# Patient Record
Sex: Male | Born: 1960
Health system: Southern US, Community
[De-identification: ages and names within clinical notes are randomized; demographics above are authoritative.]

## PROBLEM LIST (undated history)

## (undated) DIAGNOSIS — I10 Essential (primary) hypertension: Secondary | ICD-10-CM

## (undated) DIAGNOSIS — E785 Hyperlipidemia, unspecified: Secondary | ICD-10-CM

## (undated) HISTORY — PX: EYE SURGERY: SHX253

## (undated) HISTORY — DX: Hyperlipidemia, unspecified: E78.5

## (undated) HISTORY — DX: Essential (primary) hypertension: I10

## (undated) HISTORY — PX: LEG SURGERY: SHX1003

---

## 2006-12-07 ENCOUNTER — Ambulatory Visit: Payer: Self-pay | Admitting: Surgery

## 2010-05-20 ENCOUNTER — Ambulatory Visit: Payer: Self-pay | Admitting: Gastroenterology

## 2013-01-02 DIAGNOSIS — F17200 Nicotine dependence, unspecified, uncomplicated: Secondary | ICD-10-CM | POA: Insufficient documentation

## 2013-01-02 DIAGNOSIS — I10 Essential (primary) hypertension: Secondary | ICD-10-CM | POA: Insufficient documentation

## 2015-06-14 DIAGNOSIS — Z13220 Encounter for screening for lipoid disorders: Secondary | ICD-10-CM | POA: Diagnosis not present

## 2015-06-14 DIAGNOSIS — Z131 Encounter for screening for diabetes mellitus: Secondary | ICD-10-CM | POA: Diagnosis not present

## 2015-06-14 DIAGNOSIS — Z0000 Encounter for general adult medical examination without abnormal findings: Secondary | ICD-10-CM | POA: Insufficient documentation

## 2015-06-14 DIAGNOSIS — Z125 Encounter for screening for malignant neoplasm of prostate: Secondary | ICD-10-CM | POA: Diagnosis not present

## 2015-07-13 DIAGNOSIS — E119 Type 2 diabetes mellitus without complications: Secondary | ICD-10-CM | POA: Insufficient documentation

## 2016-11-19 DIAGNOSIS — Z13220 Encounter for screening for lipoid disorders: Secondary | ICD-10-CM | POA: Diagnosis not present

## 2016-11-19 DIAGNOSIS — Z0000 Encounter for general adult medical examination without abnormal findings: Secondary | ICD-10-CM | POA: Diagnosis not present

## 2016-11-19 DIAGNOSIS — R7309 Other abnormal glucose: Secondary | ICD-10-CM | POA: Diagnosis not present

## 2016-11-19 DIAGNOSIS — E119 Type 2 diabetes mellitus without complications: Secondary | ICD-10-CM | POA: Diagnosis not present

## 2016-11-19 DIAGNOSIS — Z1159 Encounter for screening for other viral diseases: Secondary | ICD-10-CM | POA: Diagnosis not present

## 2016-11-19 DIAGNOSIS — I10 Essential (primary) hypertension: Secondary | ICD-10-CM | POA: Diagnosis not present

## 2016-11-19 DIAGNOSIS — Z1389 Encounter for screening for other disorder: Secondary | ICD-10-CM | POA: Diagnosis not present

## 2018-01-06 DIAGNOSIS — Z130 Encounter for screening for diseases of the blood and blood-forming organs and certain disorders involving the immune mechanism: Secondary | ICD-10-CM | POA: Diagnosis not present

## 2018-01-06 DIAGNOSIS — E119 Type 2 diabetes mellitus without complications: Secondary | ICD-10-CM | POA: Diagnosis not present

## 2018-01-06 DIAGNOSIS — M2550 Pain in unspecified joint: Secondary | ICD-10-CM | POA: Diagnosis not present

## 2018-01-06 DIAGNOSIS — Z1389 Encounter for screening for other disorder: Secondary | ICD-10-CM | POA: Diagnosis not present

## 2018-01-06 DIAGNOSIS — I10 Essential (primary) hypertension: Secondary | ICD-10-CM | POA: Diagnosis not present

## 2018-01-06 DIAGNOSIS — E785 Hyperlipidemia, unspecified: Secondary | ICD-10-CM | POA: Insufficient documentation

## 2018-01-06 DIAGNOSIS — F17200 Nicotine dependence, unspecified, uncomplicated: Secondary | ICD-10-CM | POA: Diagnosis not present

## 2018-01-06 DIAGNOSIS — R7309 Other abnormal glucose: Secondary | ICD-10-CM | POA: Diagnosis not present

## 2018-10-03 IMAGING — CR DG CHOLANGIOGRAM OPERATIVE
1 series · 1 of 1 positions shown · non-contrast
Comparison: none

REASON FOR EXAM: Post-operative
COMMENTS:

PROCEDURE:     DXR - DXR CHOLANGIOGRAM OP (INITIAL)  - December 07, 2006  [DATE]
RESULT:     Contrast is visualized in the hepatic ducts and common duct.
Contrast is noted to flow into the duodenum without evidence of obstruction.
No retained stone is seen.

[imported/digitized images]
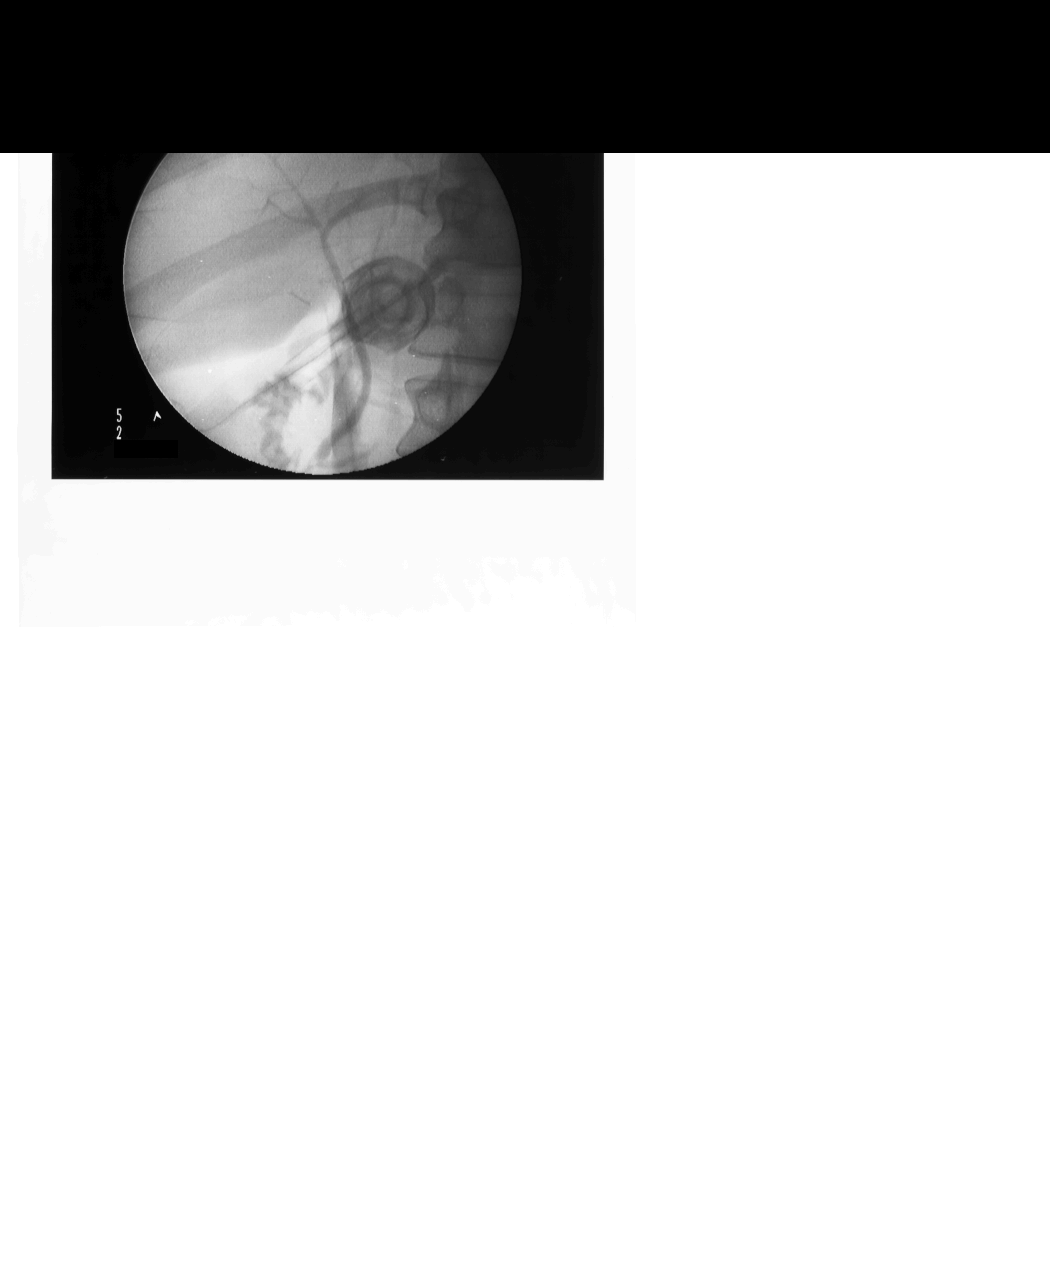

[1 of 1 positions shown; findings below may reference images not displayed]

IMPRESSION: Normal Operative Cholangiogram.

## 2019-02-14 DIAGNOSIS — E785 Hyperlipidemia, unspecified: Secondary | ICD-10-CM | POA: Diagnosis not present

## 2019-02-14 DIAGNOSIS — R001 Bradycardia, unspecified: Secondary | ICD-10-CM | POA: Diagnosis not present

## 2019-02-14 DIAGNOSIS — Z8249 Family history of ischemic heart disease and other diseases of the circulatory system: Secondary | ICD-10-CM | POA: Diagnosis not present

## 2019-02-14 DIAGNOSIS — I10 Essential (primary) hypertension: Secondary | ICD-10-CM | POA: Diagnosis not present

## 2019-02-14 DIAGNOSIS — E119 Type 2 diabetes mellitus without complications: Secondary | ICD-10-CM | POA: Diagnosis not present

## 2019-02-14 DIAGNOSIS — Z130 Encounter for screening for diseases of the blood and blood-forming organs and certain disorders involving the immune mechanism: Secondary | ICD-10-CM | POA: Diagnosis not present

## 2019-02-14 DIAGNOSIS — M2550 Pain in unspecified joint: Secondary | ICD-10-CM | POA: Diagnosis not present

## 2019-02-14 DIAGNOSIS — F17200 Nicotine dependence, unspecified, uncomplicated: Secondary | ICD-10-CM | POA: Diagnosis not present

## 2019-03-17 ENCOUNTER — Other Ambulatory Visit: Payer: Self-pay

## 2019-03-17 ENCOUNTER — Encounter: Payer: Self-pay | Admitting: Cardiology

## 2019-03-17 ENCOUNTER — Ambulatory Visit (INDEPENDENT_AMBULATORY_CARE_PROVIDER_SITE_OTHER): Payer: 59 | Admitting: Cardiology

## 2019-03-17 VITALS — BP 180/90 | HR 85 | Ht 70.0 in | Wt 201.5 lb

## 2019-03-17 DIAGNOSIS — I10 Essential (primary) hypertension: Secondary | ICD-10-CM | POA: Diagnosis not present

## 2019-03-17 DIAGNOSIS — F17200 Nicotine dependence, unspecified, uncomplicated: Secondary | ICD-10-CM | POA: Diagnosis not present

## 2019-03-17 DIAGNOSIS — Z8249 Family history of ischemic heart disease and other diseases of the circulatory system: Secondary | ICD-10-CM

## 2019-03-17 MED ORDER — HYDROCHLOROTHIAZIDE 25 MG PO TABS: 25.0000 mg | ORAL_TABLET | Freq: Every day | ORAL | 3 refills | Status: DC

## 2019-03-17 NOTE — Progress Notes (Signed)
Cardiology Office Note:    Date:  03/17/2019   ID:  Jack Richards, DOB 1960/06/08, MRN [id number]  PCP:  Patient, No Pcp Per  Cardiologist:  Debbe Odea, MD  Electrophysiologist:  None   Referring MD: Evie Lacks, NP   Chief Complaint  Patient presents with  . New Patient (Initial Visit)    Ref by Evie Lacks, NP for HTN. Meds reviewed by the pt. verbally. "doing well."     History of Present Illness:    Jack Richards is a 59 y.o. male with a hx of hypertension, hyperlipidemia, current smoker presents due to elevated blood pressures.  Patient has a history of high blood pressure for years now.  Usually takes benazepril 40 mg daily and recently started on HCTZ 12.5 mg daily.  He denies chest pain or shortness of breath at rest or with ambulation.  States not eating right, eats lots of processed foods with high salt content.  Has a family history of CAD in the father.  Also has family history of aortic aneurysm in the father.  He denies edema, palpitations, syncope.  He checks his blood pressure frequently at home which usually runs high.  Past Medical History:  Diagnosis Date  . Hyperlipidemia   . Hypertension     Past Surgical History:  Procedure Laterality Date  . EYE SURGERY    . LEG SURGERY     right leg has rods and pins     Current Medications: Current Meds  Medication Sig  . acetaminophen (TYLENOL) 500 MG tablet Take 500 mg by mouth every 6 (six) hours as needed.  Marland Kitchen aspirin EC 81 MG tablet Take 81 mg by mouth daily.  Marland Kitchen atorvastatin (LIPITOR) 20 MG tablet Take 20 mg by mouth daily.  . benazepril (LOTENSIN) 40 MG tablet Take 40 mg by mouth daily.   Marland Kitchen buPROPion (WELLBUTRIN XL) 150 MG 24 hr tablet Take 150 mg by mouth daily.   . [DISCONTINUED] hydrochlorothiazide (HYDRODIURIL) 12.5 MG tablet Take 12.5 mg by mouth daily.     Allergies:   Patient has no known allergies.   Social History   Socioeconomic History  . Marital status: Married   Spouse name: Not on file  . Number of children: Not on file  . Years of education: Not on file  . Highest education level: Not on file  Occupational History  . Not on file  Tobacco Use  . Smoking status: Current Every Day Smoker    Packs/day: 1.00    Years: 7.00    Pack years: 7.00    Types: Cigarettes  . Smokeless tobacco: Never Used  Substance and Sexual Activity  . Alcohol use: Yes    Comment: social   . Drug use: Never  . Sexual activity: Not on file  Other Topics Concern  . Not on file  Social History Narrative  . Not on file   Social Determinants of Health   Financial Resource Strain:   . Difficulty of Paying Living Expenses: Not on file  Food Insecurity:   . Worried About Programme researcher, broadcasting/film/video in the Last Year: Not on file  . Ran Out of Food in the Last Year: Not on file  Transportation Needs:   . Lack of Transportation (Medical): Not on file  . Lack of Transportation (Non-Medical): Not on file  Physical Activity:   . Days of Exercise per Week: Not on file  . Minutes of Exercise per Session: Not on file  Stress:   .  Feeling of Stress : Not on file  Social Connections:   . Frequency of Communication with Friends and Family: Not on file  . Frequency of Social Gatherings with Friends and Family: Not on file  . Attends Religious Services: Not on file  . Active Member of Clubs or Organizations: Not on file  . Attends Archivist Meetings: Not on file  . Marital Status: Not on file     Family History: The patient's family history includes ALS in his mother; Heart attack in his father; Heart disease in his brother; Heart disease (age of onset: 89) in his father; Hyperlipidemia in his father; Hypertension in his father.  ROS:   Please see the history of present illness.     All other systems reviewed and are negative.  EKGs/Labs/Other Studies Reviewed:    The following studies were reviewed today:   EKG:  EKG is  ordered today.  The ekg ordered today  demonstrates normal sinus rhythm, normal ECG.  Recent Labs: No results found for requested labs within last 8760 hours.  Recent Lipid Panel No results found for: CHOL, TRIG, HDL, CHOLHDL, VLDL, LDLCALC, LDLDIRECT  Physical Exam:    VS:  BP (!) 180/90 (BP Location: Right Arm, Patient Position: Sitting, Cuff Size: Normal)   Pulse 85   Ht 5\' 10"  (1.778 m)   Wt 201 lb 8 oz (91.4 kg)   BMI 28.91 kg/m     Wt Readings from Last 3 Encounters:  03/17/19 201 lb 8 oz (91.4 kg)     GEN:  Well nourished, well developed in no acute distress HEENT: Normal NECK: No JVD; No carotid bruits LYMPHATICS: No lymphadenopathy CARDIAC: RRR, no murmurs, rubs, gallops RESPIRATORY:  Clear to auscultation without rales, wheezing or rhonchi  ABDOMEN: Soft, non-tender, non-distended MUSCULOSKELETAL:  No edema; No deformity  SKIN: Warm and dry NEUROLOGIC:  Alert and oriented x 3 PSYCHIATRIC:  Normal affect   ASSESSMENT:    1. Hypertension, unspecified type   2. Family history of aortic aneurysm   3. Smoking    PLAN:    In order of problems listed above:  1. Blood pressure is still elevated.  Increase HCTZ to 25 mg daily.  Continue benazepril 40 mg daily.  Patient counseled on low-salt diet.  Check BP log daily.  Follow-up in 2 weeks.  Will consider adding a third BP med (CCB)on follow-up visit if blood pressures and not well controlled. 2. Patient with family history of aortic aneurysm.  Will get echocardiogram to evaluate aortic root size. 3. Patient with a 15-year smoking history.  Smoking cessation advised.  Follow-up in 2 weeks for BP management  This note was generated in part or whole with voice recognition software. Voice recognition is usually quite accurate but there are transcription errors that can and very often do occur. I apologize for any typographical errors that were not detected and corrected.  Medication Adjustments/Labs and Tests Ordered: Current medicines are reviewed at  length with the patient today.  Concerns regarding medicines are outlined above.  Orders Placed This Encounter  Procedures  . EKG 12-Lead  . ECHOCARDIOGRAM COMPLETE   Meds ordered this encounter  Medications  . hydrochlorothiazide (HYDRODIURIL) 25 MG tablet    Sig: Take 1 tablet (25 mg total) by mouth daily.    Dispense:  90 tablet    Refill:  3    Patient Instructions  Medication Instructions:  - Your physician has recommended you make the following change in your medication:  1) Increase hydrochlorothiazide (HCTZ) to 25 mg- take 1 tablet (25 mg) by mouth once daily  *If you need a refill on your cardiac medications before your next appointment, please call your pharmacy*  Lab Work: - none ordered  If you have labs (blood work) drawn today and your tests are completely normal, you will receive your results only by: Marland Kitchen MyChart Message (if you have MyChart) OR . A paper copy in the mail If you have any lab test that is abnormal or we need to change your treatment, we will call you to review the results.  Testing/Procedures: - Your physician has requested that you have an echocardiogram (ok if not done prior to follow up in 2 weeks). Echocardiography is a painless test that uses sound waves to create images of your heart. It provides your doctor with information about the size and shape of your heart and how well your heart's chambers and valves are working. This procedure takes approximately one hour. There are no restrictions for this procedure.   Follow-Up: At [facility], you and your health needs are our priority.  As part of our continuing mission to provide you with exceptional heart care, we have created designated Provider Care Teams.  These Care Teams include your primary Cardiologist (physician) and Advanced Practice Providers (APPs -  Physician Assistants and Nurse Practitioners) who all work together to provide you with the care you need, when you need it.  Your  next appointment:   2 week(s)  The format for your next appointment:   In Person  Provider:   Debbe Odea, MD  Other Instructions N/a   Echocardiogram An echocardiogram is a procedure that uses painless sound waves (ultrasound) to produce an image of the heart. Images from an echocardiogram can provide important information about:  Signs of coronary artery disease (CAD).  Aneurysm detection. An aneurysm is a weak or damaged part of an artery wall that bulges out from the normal force of blood pumping through the body.  Heart size and shape. Changes in the size or shape of the heart can be associated with certain conditions, including heart failure, aneurysm, and CAD.  Heart muscle function.  Heart valve function.  Signs of a past heart attack.  Fluid buildup around the heart.  Thickening of the heart muscle.  A tumor or infectious growth around the heart valves. Tell a health care provider about:  Any allergies you have.  All medicines you are taking, including vitamins, herbs, eye drops, creams, and over-the-counter medicines.  Any blood disorders you have.  Any surgeries you have had.  Any medical conditions you have.  Whether you are pregnant or may be pregnant. What are the risks? Generally, this is a safe procedure. However, problems may occur, including:  Allergic reaction to dye (contrast) that may be used during the procedure. What happens before the procedure? No specific preparation is needed. You may eat and drink normally. What happens during the procedure?   An IV tube may be inserted into one of your veins.  You may receive contrast through this tube. A contrast is an injection that improves the quality of the pictures from your heart.  A gel will be applied to your chest.  A wand-like tool (transducer) will be moved over your chest. The gel will help to transmit the sound waves from the transducer.  The sound waves will harmlessly  bounce off of your heart to allow the heart images to be captured in real-time motion.  The images will be recorded on a computer. The procedure may vary among health care providers and hospitals. What happens after the procedure?  You may return to your normal, everyday life, including diet, activities, and medicines, unless your health care provider tells you not to do that. Summary  An echocardiogram is a procedure that uses painless sound waves (ultrasound) to produce an image of the heart.  Images from an echocardiogram can provide important information about the size and shape of your heart, heart muscle function, heart valve function, and fluid buildup around your heart.  You do not need to do anything to prepare before this procedure. You may eat and drink normally.  After the echocardiogram is completed, you may return to your normal, everyday life, unless your health care provider tells you not to do that. This information is not intended to replace advice given to you by your health care provider. Make sure you discuss any questions you have with your health care provider. Document Revised: 06/02/2018 Document Reviewed: 03/14/2016 Elsevier Patient Education  2020 [employer].      Signed, Debbe Odea, MD  03/17/2019 9:13 AM    [facility]

## 2019-03-17 NOTE — Patient Instructions (Addendum)
Medication Instructions:  - Your physician has recommended you make the following change in your medication:   1) Increase hydrochlorothiazide (HCTZ) to 25 mg- take 1 tablet (25 mg) by mouth once daily  *If you need a refill on your cardiac medications before your next appointment, please call your pharmacy*  Lab Work: - none ordered  If you have labs (blood work) drawn today and your tests are completely normal, you will receive your results only by: Marland Kitchen MyChart Message (if you have MyChart) OR . A paper copy in the mail If you have any lab test that is abnormal or we need to change your treatment, we will call you to review the results.  Testing/Procedures: - Your physician has requested that you have an echocardiogram (ok if not done prior to follow up in 2 weeks). Echocardiography is a painless test that uses sound waves to create images of your heart. It provides your doctor with information about the size and shape of your heart and how well your heart's chambers and valves are working. This procedure takes approximately one hour. There are no restrictions for this procedure.   Follow-Up: At [facility], you and your health needs are our priority.  As part of our continuing mission to provide you with exceptional heart care, we have created designated Provider Care Teams.  These Care Teams include your primary Cardiologist (physician) and Advanced Practice Providers (APPs -  Physician Assistants and Nurse Practitioners) who all work together to provide you with the care you need, when you need it.  Your next appointment:   2 week(s)  The format for your next appointment:   In Person  Provider:   Kate Sable, MD  Other Instructions N/a   Echocardiogram An echocardiogram is a procedure that uses painless sound waves (ultrasound) to produce an image of the heart. Images from an echocardiogram can provide important information about:  Signs of coronary artery disease  (CAD).  Aneurysm detection. An aneurysm is a weak or damaged part of an artery wall that bulges out from the normal force of blood pumping through the body.  Heart size and shape. Changes in the size or shape of the heart can be associated with certain conditions, including heart failure, aneurysm, and CAD.  Heart muscle function.  Heart valve function.  Signs of a past heart attack.  Fluid buildup around the heart.  Thickening of the heart muscle.  A tumor or infectious growth around the heart valves. Tell a health care provider about:  Any allergies you have.  All medicines you are taking, including vitamins, herbs, eye drops, creams, and over-the-counter medicines.  Any blood disorders you have.  Any surgeries you have had.  Any medical conditions you have.  Whether you are pregnant or may be pregnant. What are the risks? Generally, this is a safe procedure. However, problems may occur, including:  Allergic reaction to dye (contrast) that may be used during the procedure. What happens before the procedure? No specific preparation is needed. You may eat and drink normally. What happens during the procedure?   An IV tube may be inserted into one of your veins.  You may receive contrast through this tube. A contrast is an injection that improves the quality of the pictures from your heart.  A gel will be applied to your chest.  A wand-like tool (transducer) will be moved over your chest. The gel will help to transmit the sound waves from the transducer.  The sound waves will  harmlessly bounce off of your heart to allow the heart images to be captured in real-time motion. The images will be recorded on a computer. The procedure may vary among health care providers and hospitals. What happens after the procedure?  You may return to your normal, everyday life, including diet, activities, and medicines, unless your health care provider tells you not to do that. Summary   An echocardiogram is a procedure that uses painless sound waves (ultrasound) to produce an image of the heart.  Images from an echocardiogram can provide important information about the size and shape of your heart, heart muscle function, heart valve function, and fluid buildup around your heart.  You do not need to do anything to prepare before this procedure. You may eat and drink normally.  After the echocardiogram is completed, you may return to your normal, everyday life, unless your health care provider tells you not to do that. This information is not intended to replace advice given to you by your health care provider. Make sure you discuss any questions you have with your health care provider. Document Revised: 06/02/2018 Document Reviewed: 03/14/2016 Elsevier Patient Education  2020 [employer].

## 2019-03-24 ENCOUNTER — Other Ambulatory Visit: Payer: Self-pay | Admitting: Cardiology

## 2019-03-31 ENCOUNTER — Ambulatory Visit (INDEPENDENT_AMBULATORY_CARE_PROVIDER_SITE_OTHER): Payer: 59 | Admitting: Cardiology

## 2019-03-31 ENCOUNTER — Other Ambulatory Visit: Payer: Self-pay

## 2019-03-31 ENCOUNTER — Encounter: Payer: Self-pay | Admitting: Cardiology

## 2019-03-31 VITALS — BP 160/90 | HR 71 | Temp 98.1°F | Ht 70.0 in | Wt 199.2 lb

## 2019-03-31 DIAGNOSIS — F17200 Nicotine dependence, unspecified, uncomplicated: Secondary | ICD-10-CM

## 2019-03-31 DIAGNOSIS — Z8249 Family history of ischemic heart disease and other diseases of the circulatory system: Secondary | ICD-10-CM | POA: Diagnosis not present

## 2019-03-31 DIAGNOSIS — I10 Essential (primary) hypertension: Secondary | ICD-10-CM

## 2019-03-31 MED ORDER — BENAZEPRIL HCL 40 MG PO TABS: 40.0000 mg | ORAL_TABLET | Freq: Every day | ORAL | 1 refills | Status: DC

## 2019-03-31 MED ORDER — ATORVASTATIN CALCIUM 20 MG PO TABS: 20.0000 mg | ORAL_TABLET | Freq: Every day | ORAL | 1 refills | Status: DC

## 2019-03-31 NOTE — Progress Notes (Signed)
Cardiology Office Note:    Date:  03/31/2019   ID:  Jack Richards, DOB Nov 06, 1960, MRN [id number]  PCP:  Patient, No Pcp Per  Cardiologist:  Debbe Odea, MD  Electrophysiologist:  None   Referring MD: No ref. provider found   Chief Complaint  Patient presents with  . office visit    2 week F/U; Meds verbally reviewed with patient.    History of Present Illness:    Jack Richards is a 59 y.o. male with a hx of hypertension, hyperlipidemia, current smoker presents for follow-up.  He was last seen due to elevated blood pressures.    Patient has a history of high blood pressure for years .  Usually takes benazepril 40 mg daily and recently started on HCTZ 12.5 mg daily.  He denies chest pain or shortness of breath at rest or with ambulation.  States not eating right, eats lots of processed foods with high salt content.  Has a family history of CAD in the father.  Also has family history of aortic aneurysm in the father.  He denies edema, palpitations, syncope.  He checks his blood pressure frequently at home which usually runs high.  His hydrochlorothiazide was increased to 25 mg after last visit.  Echocardiogram was ordered due to family history of aortic aneurysm and is currently scheduled.  She had run out of his benazepril and has not taken for 2 days now.  Tried calling PCP's office without success.  Past Medical History:  Diagnosis Date  . Hyperlipidemia   . Hypertension     Past Surgical History:  Procedure Laterality Date  . EYE SURGERY    . LEG SURGERY     right leg has rods and pins     Current Medications: Current Meds  Medication Sig  . acetaminophen (TYLENOL) 500 MG tablet Take 500 mg by mouth every 6 (six) hours as needed.  Marland Kitchen aspirin EC 81 MG tablet Take 81 mg by mouth daily.  Marland Kitchen atorvastatin (LIPITOR) 20 MG tablet Take 1 tablet (20 mg total) by mouth daily.  Marland Kitchen buPROPion (WELLBUTRIN XL) 150 MG 24 hr tablet Take 150 mg by mouth daily.   .  hydrochlorothiazide (HYDRODIURIL) 25 MG tablet Take 1 tablet (25 mg total) by mouth daily.  . [DISCONTINUED] atorvastatin (LIPITOR) 20 MG tablet Take 20 mg by mouth daily.     Allergies:   Patient has no known allergies.   Social History   Socioeconomic History  . Marital status: Married    Spouse name: Not on file  . Number of children: Not on file  . Years of education: Not on file  . Highest education level: Not on file  Occupational History  . Not on file  Tobacco Use  . Smoking status: Current Every Day Smoker    Packs/day: 1.00    Years: 7.00    Pack years: 7.00    Types: Cigarettes  . Smokeless tobacco: Never Used  Substance and Sexual Activity  . Alcohol use: Yes    Comment: social   . Drug use: Never  . Sexual activity: Not on file  Other Topics Concern  . Not on file  Social History Narrative  . Not on file   Social Determinants of Health   Financial Resource Strain:   . Difficulty of Paying Living Expenses: Not on file  Food Insecurity:   . Worried About Programme researcher, broadcasting/film/video in the Last Year: Not on file  . Ran Out of Food  in the Last Year: Not on file  Transportation Needs:   . Lack of Transportation (Medical): Not on file  . Lack of Transportation (Non-Medical): Not on file  Physical Activity:   . Days of Exercise per Week: Not on file  . Minutes of Exercise per Session: Not on file  Stress:   . Feeling of Stress : Not on file  Social Connections:   . Frequency of Communication with Friends and Family: Not on file  . Frequency of Social Gatherings with Friends and Family: Not on file  . Attends Religious Services: Not on file  . Active Member of Clubs or Organizations: Not on file  . Attends Banker Meetings: Not on file  . Marital Status: Not on file     Family History: The patient's family history includes ALS in his mother; Heart attack in his father; Heart disease in his brother; Heart disease (age of onset: 60) in his father;  Hyperlipidemia in his father; Hypertension in his father.  ROS:   Please see the history of present illness.     All other systems reviewed and are negative.  EKGs/Labs/Other Studies Reviewed:    The following studies were reviewed today:   EKG:  EKG is  ordered today.  The ekg ordered today demonstrates normal sinus rhythm, normal ECG.  Recent Labs: No results found for requested labs within last 8760 hours.  Recent Lipid Panel No results found for: CHOL, TRIG, HDL, CHOLHDL, VLDL, LDLCALC, LDLDIRECT  Physical Exam:    VS:  BP (!) 160/90 (BP Location: Left Arm, Patient Position: Sitting, Cuff Size: Normal)   Pulse 71   Temp 98.1 F (36.7 C)   Ht 5\' 10"  (1.778 m)   Wt 199 lb 4 oz (90.4 kg)   SpO2 96%   BMI 28.59 kg/m     Wt Readings from Last 3 Encounters:  03/31/19 199 lb 4 oz (90.4 kg)  03/17/19 201 lb 8 oz (91.4 kg)     GEN:  Well nourished, well developed in no acute distress HEENT: Normal NECK: No JVD; No carotid bruits LYMPHATICS: No lymphadenopathy CARDIAC: RRR, no murmurs, rubs, gallops RESPIRATORY:  Clear to auscultation without rales, wheezing or rhonchi  ABDOMEN: Soft, non-tender, non-distended MUSCULOSKELETAL:  No edema; No deformity  SKIN: Warm and dry NEUROLOGIC:  Alert and oriented x 3 PSYCHIATRIC:  Normal affect   ASSESSMENT:    1. Hypertension, unspecified type   2. Family history of aortic aneurysm   3. Smoking     PLAN:    In order of problems listed above:  1. Blood pressure is improved but still elevated possibly due to not taking benazepril in the past 2 days.  Take HCTZ to 25 mg daily.   benazepril 40 mg daily.  We will send refills to his pharmacy.  Patient counseled on medication compliance and low-salt diet.  Check BP log daily.    Will consider adding a third BP med (CCB)on follow-up visit if blood pressures are still not well controlled. 2. Patient with family history of aortic aneurysm.  echocardiogram is scheduled to evaluate  aortic root and ascending aorta size. 3. Patient with a 15-year smoking history.  Smoking cessation advised.  Follow-up after echocardiogram in about 3 weeks.  This note was generated in part or whole with voice recognition software. Voice recognition is usually quite accurate but there are transcription errors that can and very often do occur. I apologize for any typographical errors that were not detected  and corrected.  Medication Adjustments/Labs and Tests Ordered: Current medicines are reviewed at length with the patient today.  Concerns regarding medicines are outlined above.  Orders Placed This Encounter  Procedures  . EKG 12-Lead   Meds ordered this encounter  Medications  . atorvastatin (LIPITOR) 20 MG tablet    Sig: Take 1 tablet (20 mg total) by mouth daily.    Dispense:  90 tablet    Refill:  1  . benazepril (LOTENSIN) 40 MG tablet    Sig: Take 1 tablet (40 mg total) by mouth daily.    Dispense:  90 tablet    Refill:  1    Patient Instructions  Medication Instructions:  - Your physician recommends that you continue on your current medications as directed. Please refer to the Current Medication list given to you today.  - refills have been sent in for benazepril & atorvastatin   *If you need a refill on your cardiac medications before your next appointment, please call your pharmacy*  Lab Work: - none ordered If you have labs (blood work) drawn today and your tests are completely normal, you will receive your results only by: Marland Kitchen MyChart Message (if you have MyChart) OR . A paper copy in the mail If you have any lab test that is abnormal or we need to change your treatment, we will call you to review the results.  Testing/Procedures: - Echocardiogram as scheduled on 04/04/19  Follow-Up: At [facility], you and your health needs are our priority.  As part of our continuing mission to provide you with exceptional heart care, we have created designated Provider  Care Teams.  These Care Teams include your primary Cardiologist (physician) and Advanced Practice Providers (APPs -  Physician Assistants and Nurse Practitioners) who all work together to provide you with the care you need, when you need it.  Your next appointment:   3 week(s)  The format for your next appointment:   In Person  Provider:   Kate Sable, MD  Other Instructions n/a     Signed, Kate Sable, MD  03/31/2019 12:19 PM    [place]

## 2019-03-31 NOTE — Patient Instructions (Signed)
Medication Instructions:  - Your physician recommends that you continue on your current medications as directed. Please refer to the Current Medication list given to you today.  - refills have been sent in for benazepril & atorvastatin   *If you need a refill on your cardiac medications before your next appointment, please call your pharmacy*  Lab Work: - none ordered If you have labs (blood work) drawn today and your tests are completely normal, you will receive your results only by: Marland Kitchen MyChart Message (if you have MyChart) OR . A paper copy in the mail If you have any lab test that is abnormal or we need to change your treatment, we will call you to review the results.  Testing/Procedures: - Echocardiogram as scheduled on 04/04/19  Follow-Up: At [facility], you and your health needs are our priority.  As part of our continuing mission to provide you with exceptional heart care, we have created designated Provider Care Teams.  These Care Teams include your primary Cardiologist (physician) and Advanced Practice Providers (APPs -  Physician Assistants and Nurse Practitioners) who all work together to provide you with the care you need, when you need it.  Your next appointment:   3 week(s)  The format for your next appointment:   In Person  Provider:   Debbe Odea, MD  Other Instructions n/a

## 2019-04-01 ENCOUNTER — Other Ambulatory Visit: Payer: Self-pay | Admitting: Family

## 2019-04-04 ENCOUNTER — Other Ambulatory Visit: Payer: Self-pay

## 2019-04-04 ENCOUNTER — Ambulatory Visit (INDEPENDENT_AMBULATORY_CARE_PROVIDER_SITE_OTHER): Payer: 59

## 2019-04-04 DIAGNOSIS — Z8249 Family history of ischemic heart disease and other diseases of the circulatory system: Secondary | ICD-10-CM | POA: Diagnosis not present

## 2019-04-04 DIAGNOSIS — I10 Essential (primary) hypertension: Secondary | ICD-10-CM | POA: Diagnosis not present

## 2019-04-06 ENCOUNTER — Telehealth: Payer: Self-pay

## 2019-04-06 NOTE — Telephone Encounter (Signed)
-----   Message from Debbe Odea, MD sent at 04/06/2019  1:20 PM EST ----- Normal systolic and diastolic function.  Normal study.

## 2019-04-06 NOTE — Telephone Encounter (Signed)
Call to patient to discuss echo results.   Pt verbalized understanding and had no further questions at this time.   Advised pt to call for any further questions or concerns.

## 2019-04-21 ENCOUNTER — Ambulatory Visit (INDEPENDENT_AMBULATORY_CARE_PROVIDER_SITE_OTHER): Payer: 59 | Admitting: Cardiology

## 2019-04-21 ENCOUNTER — Other Ambulatory Visit: Payer: Self-pay

## 2019-04-21 ENCOUNTER — Encounter: Payer: Self-pay | Admitting: Cardiology

## 2019-04-21 VITALS — BP 140/78 | HR 68 | Ht 70.5 in | Wt 200.5 lb

## 2019-04-21 DIAGNOSIS — E7800 Pure hypercholesterolemia, unspecified: Secondary | ICD-10-CM | POA: Diagnosis not present

## 2019-04-21 DIAGNOSIS — Z8249 Family history of ischemic heart disease and other diseases of the circulatory system: Secondary | ICD-10-CM

## 2019-04-21 DIAGNOSIS — I10 Essential (primary) hypertension: Secondary | ICD-10-CM | POA: Diagnosis not present

## 2019-04-21 DIAGNOSIS — F17200 Nicotine dependence, unspecified, uncomplicated: Secondary | ICD-10-CM

## 2019-04-21 NOTE — Progress Notes (Signed)
Cardiology Office Note:    Date:  04/21/2019   ID:  Jack Richards, DOB 1960-12-11, MRN [id number]  PCP:  Patient, No Pcp Per  Cardiologist:  Debbe Odea, MD  Electrophysiologist:  None   Referring MD: No ref. provider found   Chief Complaint  Patient presents with  . other    Follow up Echo. Meds reviewed by the pt. verbally. "doing well."  "blood pressue still elevated in the am."    History of Present Illness:    Jack Richards is a 59 y.o. male with a hx of hypertension, hyperlipidemia, current smoker presents for follow-up.  He was last seen due to elevated blood pressures and history of aortic aneurysm.    Patient has a history of high blood pressure for years .  Usually takes benazepril 40 mg daily and recently started on HCTZ 12.5 mg daily.  He denies chest pain or shortness of breath at rest or with ambulation.  States not eating right, eats lots of processed foods with high salt content.  Has a family history of CAD in the father.  Also has family history of aortic aneurysm in the father.  He denies edema, palpitations, syncope.  He checks his blood pressure frequently at home which runs around 120s in the evenings and 140s in the mornings.  Takes his blood pressure pills in the morning.  His hydrochlorothiazide was increased to 25 mg medication compliance emphasized.  Echocardiogram was ordered due to family history of aortic aneurysm .    Past Medical History:  Diagnosis Date  . Hyperlipidemia   . Hypertension     Past Surgical History:  Procedure Laterality Date  . EYE SURGERY    . LEG SURGERY     right leg has rods and pins     Current Medications: Current Meds  Medication Sig  . acetaminophen (TYLENOL) 500 MG tablet Take 500 mg by mouth every 6 (six) hours as needed.  Marland Kitchen aspirin EC 81 MG tablet Take 81 mg by mouth daily.  Marland Kitchen atorvastatin (LIPITOR) 20 MG tablet Take 1 tablet (20 mg total) by mouth daily.  . benazepril (LOTENSIN) 40 MG tablet Take 1  tablet (40 mg total) by mouth daily.  Marland Kitchen buPROPion (WELLBUTRIN XL) 150 MG 24 hr tablet Take 150 mg by mouth daily.   . hydrochlorothiazide (HYDRODIURIL) 25 MG tablet Take 1 tablet (25 mg total) by mouth daily.     Allergies:   Patient has no known allergies.   Social History   Socioeconomic History  . Marital status: Married    Spouse name: Not on file  . Number of children: Not on file  . Years of education: Not on file  . Highest education level: Not on file  Occupational History  . Not on file  Tobacco Use  . Smoking status: Current Every Day Smoker    Packs/day: 1.00    Years: 7.00    Pack years: 7.00    Types: Cigarettes  . Smokeless tobacco: Never Used  Substance and Sexual Activity  . Alcohol use: Yes    Comment: social   . Drug use: Never  . Sexual activity: Not on file  Other Topics Concern  . Not on file  Social History Narrative  . Not on file   Social Determinants of Health   Financial Resource Strain:   . Difficulty of Paying Living Expenses: Not on file  Food Insecurity:   . Worried About Programme researcher, broadcasting/film/video in the Last  Year: Not on file  . Ran Out of Food in the Last Year: Not on file  Transportation Needs:   . Lack of Transportation (Medical): Not on file  . Lack of Transportation (Non-Medical): Not on file  Physical Activity:   . Days of Exercise per Week: Not on file  . Minutes of Exercise per Session: Not on file  Stress:   . Feeling of Stress : Not on file  Social Connections:   . Frequency of Communication with Friends and Family: Not on file  . Frequency of Social Gatherings with Friends and Family: Not on file  . Attends Religious Services: Not on file  . Active Member of Clubs or Organizations: Not on file  . Attends Archivist Meetings: Not on file  . Marital Status: Not on file     Family History: The patient's family history includes ALS in his mother; Heart attack in his father; Heart disease in his brother; Heart disease  (age of onset: 52) in his father; Hyperlipidemia in his father; Hypertension in his father.  ROS:   Please see the history of present illness.     All other systems reviewed and are negative.  EKGs/Labs/Other Studies Reviewed:    The following studies were reviewed today:   EKG:  EKG is  ordered today.  The ekg ordered today demonstrates normal sinus rhythm, normal ECG.  Recent Labs: No results found for requested labs within last 8760 hours.  Recent Lipid Panel No results found for: CHOL, TRIG, HDL, CHOLHDL, VLDL, LDLCALC, LDLDIRECT  Physical Exam:    VS:  BP 140/78 (BP Location: Left Arm, Patient Position: Sitting, Cuff Size: Normal)   Pulse 68   Ht 5' 10.5" (1.791 m)   Wt 200 lb 8 oz (90.9 kg)   SpO2 98%   BMI 28.36 kg/m     Wt Readings from Last 3 Encounters:  04/21/19 200 lb 8 oz (90.9 kg)  03/31/19 199 lb 4 oz (90.4 kg)  03/17/19 201 lb 8 oz (91.4 kg)     GEN:  Well nourished, well developed in no acute distress HEENT: Normal NECK: No JVD; No carotid bruits LYMPHATICS: No lymphadenopathy CARDIAC: RRR, no murmurs, rubs, gallops RESPIRATORY:  Clear to auscultation without rales, wheezing or rhonchi  ABDOMEN: Soft, non-tender, non-distended MUSCULOSKELETAL:  No edema; No deformity  SKIN: Warm and dry NEUROLOGIC:  Alert and oriented x 3 PSYCHIATRIC:  Normal affect   ASSESSMENT:    1. Hypertension, unspecified type   2. Family history of aortic aneurysm   3. Smoking   4. Pure hypercholesterolemia     PLAN:    In order of problems listed above:  1. Blood pressure is notably controlled. Continue HCTZ to 25 mg daily, benazepril 40 mg daily.  We will send refills to his pharmacy.  Patient counseled on medication compliance and low-salt diet.  2. Patient with family history of aortic aneurysm.  echocardiogram did not show any ascending aorta or aortic root dilatation.  Patient made aware of results. 3. Patient is a current smoker.  Smoking cessation  advised. 4. History of hyperlipidemia, continue statin.  Follow-up in 3 months.  This note was generated in part or whole with voice recognition software. Voice recognition is usually quite accurate but there are transcription errors that can and very often do occur. I apologize for any typographical errors that were not detected and corrected.  Medication Adjustments/Labs and Tests Ordered: Current medicines are reviewed at length with the patient today.  Concerns regarding medicines are outlined above.  Orders Placed This Encounter  Procedures  . EKG 12-Lead   No orders of the defined types were placed in this encounter.   Patient Instructions  Medication Instructions:  Your physician recommends that you continue on your current medications as directed. Please refer to the Current Medication list given to you today.  *If you need a refill on your cardiac medications before your next appointment, please call your pharmacy*   Lab Work: none If you have labs (blood work) drawn today and your tests are completely normal, you will receive your results only by: Marland Kitchen MyChart Message (if you have MyChart) OR . A paper copy in the mail If you have any lab test that is abnormal or we need to change your treatment, we will call you to review the results.   Testing/Procedures: none   Follow-Up: At [facility], you and your health needs are our priority.  As part of our continuing mission to provide you with exceptional heart care, we have created designated Provider Care Teams.  These Care Teams include your primary Cardiologist (physician) and Advanced Practice Providers (APPs -  Physician Assistants and Nurse Practitioners) who all work together to provide you with the care you need, when you need it.  We recommend signing up for the patient portal called "MyChart".  Sign up information is provided on this After Visit Summary.  MyChart is used to connect with patients for Virtual Visits  (Telemedicine).  Patients are able to view lab/test results, encounter notes, upcoming appointments, etc.  Non-urgent messages can be sent to your provider as well.   To learn more about what you can do with MyChart, go to [email or web address].    Your next appointment:   3 month(s)  The format for your next appointment:   In Person  Provider:   Debbe Odea, MD     Signed, Debbe Odea, MD  04/21/2019 9:55 AM    [facility]

## 2019-04-21 NOTE — Patient Instructions (Signed)
Medication Instructions:  Your physician recommends that you continue on your current medications as directed. Please refer to the Current Medication list given to you today.  *If you need a refill on your cardiac medications before your next appointment, please call your pharmacy*   Lab Work: none If you have labs (blood work) drawn today and your tests are completely normal, you will receive your results only by: Marland Kitchen MyChart Message (if you have MyChart) OR . A paper copy in the mail If you have any lab test that is abnormal or we need to change your treatment, we will call you to review the results.   Testing/Procedures: none   Follow-Up: At [facility], you and your health needs are our priority.  As part of our continuing mission to provide you with exceptional heart care, we have created designated Provider Care Teams.  These Care Teams include your primary Cardiologist (physician) and Advanced Practice Providers (APPs -  Physician Assistants and Nurse Practitioners) who all work together to provide you with the care you need, when you need it.  We recommend signing up for the patient portal called "MyChart".  Sign up information is provided on this After Visit Summary.  MyChart is used to connect with patients for Virtual Visits (Telemedicine).  Patients are able to view lab/test results, encounter notes, upcoming appointments, etc.  Non-urgent messages can be sent to your provider as well.   To learn more about what you can do with MyChart, go to [email or web address].    Your next appointment:   3 month(s)  The format for your next appointment:   In Person  Provider:   Debbe Odea, MD

## 2019-05-24 DIAGNOSIS — Z1389 Encounter for screening for other disorder: Secondary | ICD-10-CM | POA: Diagnosis not present

## 2019-05-24 DIAGNOSIS — M546 Pain in thoracic spine: Secondary | ICD-10-CM | POA: Insufficient documentation

## 2019-07-21 ENCOUNTER — Encounter: Payer: Self-pay | Admitting: Cardiology

## 2019-07-21 ENCOUNTER — Other Ambulatory Visit: Payer: Self-pay

## 2019-07-21 ENCOUNTER — Ambulatory Visit: Payer: 59 | Admitting: Cardiology

## 2019-07-21 VITALS — BP 128/70 | HR 78 | Ht 70.5 in | Wt 198.5 lb

## 2019-07-21 DIAGNOSIS — I10 Essential (primary) hypertension: Secondary | ICD-10-CM

## 2019-07-21 DIAGNOSIS — F17200 Nicotine dependence, unspecified, uncomplicated: Secondary | ICD-10-CM

## 2019-07-21 DIAGNOSIS — E7800 Pure hypercholesterolemia, unspecified: Secondary | ICD-10-CM | POA: Diagnosis not present

## 2019-07-21 NOTE — Patient Instructions (Signed)
Medication Instructions:  No changes  *If you need a refill on your cardiac medications before your next appointment, please call your pharmacy*   Lab Work: None  If you have labs (blood work) drawn today and your tests are completely normal, you will receive your results only by: Marland Kitchen MyChart Message (if you have MyChart) OR . A paper copy in the mail If you have any lab test that is abnormal or we need to change your treatment, we will call you to review the results.   Testing/Procedures: None   Follow-Up: At [facility], you and your health needs are our priority.  As part of our continuing mission to provide you with exceptional heart care, we have created designated Provider Care Teams.  These Care Teams include your primary Cardiologist (physician) and Advanced Practice Providers (APPs -  Physician Assistants and Nurse Practitioners) who all work together to provide you with the care you need, when you need it.  We recommend signing up for the patient portal called "MyChart".  Sign up information is provided on this After Visit Summary.  MyChart is used to connect with patients for Virtual Visits (Telemedicine).  Patients are able to view lab/test results, encounter notes, upcoming appointments, etc.  Non-urgent messages can be sent to your provider as well.   To learn more about what you can do with MyChart, go to [email or web address].    Your next appointment:   12 month(s)  The format for your next appointment:   In Person  Provider:    You may see Debbe Odea, MD or one of the following Advanced Practice Providers on your designated Care Team:    Nicolasa Ducking, NP  Eula Listen, PA-C  Marisue Ivan, PA-C

## 2019-07-21 NOTE — Progress Notes (Signed)
Cardiology Office Note:    Date:  07/21/2019   ID:  [name], DOB 1960/08/29, MRN [id number]  PCP:  Patient, No Pcp Per  Cardiologist:  Kate Sable, MD  Electrophysiologist:  None   Referring MD: No ref. provider found   Chief Complaint  Patient presents with  . other    3 month f/u no complaints today. Meds reviewed verbally with pt.    History of Present Illness:    Jack Richards is a 59 y.o. male with a hx of hypertension, hyperlipidemia, current smoker presents for follow-up.  He is being seen for blood pressure and cholesterol management.  Medication optimization and compliance was recommended on prior visits.  Patient still smokes but is working on cutting down amount of cigarettes.  He otherwise feels fine, has no concerns at this time.  He states having cholesterol checked at his primary care physician's office within the past year.  Echocardiogram showed normal systolic and diastolic function, EF 60 to 65%.  Patient currently on HCTZ and benazepril.  Historical notes Patient has a history of high blood pressure for years .  Usually takes benazepril 40 mg daily and recently started on HCTZ 12.5 mg daily.  He denies chest pain or shortness of breath at rest or with ambulation.  States not eating right, eats lots of processed foods with high salt content.  Has a family history of CAD in the father.  Also has family history of aortic aneurysm in the father.  He denies edema, palpitations, syncope.  He checks his blood pressure frequently at home which runs around 120s in the evenings and 140s in the mornings.  Takes his blood pressure pills in the morning.   Past Medical History:  Diagnosis Date  . Hyperlipidemia   . Hypertension     Past Surgical History:  Procedure Laterality Date  . EYE SURGERY    . LEG SURGERY     right leg has rods and pins     Current Medications: Current Meds  Medication Sig  . acetaminophen (TYLENOL) 500 MG tablet Take 500 mg  by mouth every 6 (six) hours as needed.  Marland Kitchen aspirin EC 81 MG tablet Take 81 mg by mouth daily.  Marland Kitchen atorvastatin (LIPITOR) 20 MG tablet Take 1 tablet (20 mg total) by mouth daily.  . benazepril (LOTENSIN) 40 MG tablet Take 1 tablet (40 mg total) by mouth daily.  Marland Kitchen buPROPion (WELLBUTRIN XL) 150 MG 24 hr tablet Take 150 mg by mouth daily.   . hydrochlorothiazide (HYDRODIURIL) 25 MG tablet Take 1 tablet (25 mg total) by mouth daily.     Allergies:   Patient has no known allergies.   Social History   Socioeconomic History  . Marital status: Married    Spouse name: Not on file  . Number of children: Not on file  . Years of education: Not on file  . Highest education level: Not on file  Occupational History  . Not on file  Tobacco Use  . Smoking status: Current Every Day Smoker    Packs/day: 1.00    Years: 7.00    Pack years: 7.00    Types: Cigarettes  . Smokeless tobacco: Never Used  Substance and Sexual Activity  . Alcohol use: Yes    Comment: social   . Drug use: Never  . Sexual activity: Not on file  Other Topics Concern  . Not on file  Social History Narrative  . Not on file   Social Determinants  of Health   Financial Resource Strain:   . Difficulty of Paying Living Expenses:   Food Insecurity:   . Worried About Programme researcher, broadcasting/film/video in the Last Year:   . Barista in the Last Year:   Transportation Needs:   . Freight forwarder (Medical):   Marland Kitchen Lack of Transportation (Non-Medical):   Physical Activity:   . Days of Exercise per Week:   . Minutes of Exercise per Session:   Stress:   . Feeling of Stress :   Social Connections:   . Frequency of Communication with Friends and Family:   . Frequency of Social Gatherings with Friends and Family:   . Attends Religious Services:   . Active Member of Clubs or Organizations:   . Attends Banker Meetings:   Marland Kitchen Marital Status:      Family History: The patient's family history includes ALS in his mother;  Heart attack in his father; Heart disease in his brother; Heart disease (age of onset: 8) in his father; Hyperlipidemia in his father; Hypertension in his father.  ROS:   Please see the history of present illness.     All other systems reviewed and are negative.  EKGs/Labs/Other Studies Reviewed:    The following studies were reviewed today:   EKG:  EKG is  ordered today.  The ekg ordered today demonstrates normal sinus rhythm, normal ECG.  Recent Labs: No results found for requested labs within last 8760 hours.  Recent Lipid Panel No results found for: CHOL, TRIG, HDL, CHOLHDL, VLDL, LDLCALC, LDLDIRECT  Physical Exam:    VS:  BP 128/70 (BP Location: Left Arm, Patient Position: Sitting, Cuff Size: Normal)   Pulse 78   Ht 5' 10.5" (1.791 m)   Wt 198 lb 8 oz (90 kg)   SpO2 99%   BMI 28.08 kg/m     Wt Readings from Last 3 Encounters:  07/21/19 198 lb 8 oz (90 kg)  04/21/19 200 lb 8 oz (90.9 kg)  03/31/19 199 lb 4 oz (90.4 kg)     GEN:  Well nourished, well developed in no acute distress HEENT: Normal NECK: No JVD; No carotid bruits LYMPHATICS: No lymphadenopathy CARDIAC: RRR, no murmurs, rubs, gallops RESPIRATORY:  Clear to auscultation without rales, wheezing or rhonchi  ABDOMEN: Soft, non-tender, non-distended MUSCULOSKELETAL:  No edema; No deformity  SKIN: Warm and dry NEUROLOGIC:  Alert and oriented x 3 PSYCHIATRIC:  Normal affect   ASSESSMENT:    1. Hypertension, unspecified type   2. Smoking   3. Pure hypercholesterolemia     PLAN:    In order of problems listed above:  1. Patient with history of hypertension, blood pressure well controlled today. Continue HCTZ to 25 mg daily, benazepril 40 mg daily.    2. Patient is a current smoker.  Smoking cessation advised. 3. History of hyperlipidemia.  No lipid panel results noted in chart.  Can recently obtain cholesterol checks via her primary care provider.  Encourage patient to fax over results, or tell primary  care provider to fax over lipid results.  Continue statin as prescribed for now.  Will make adjustments to his lipids if needed after reviewing lipid panel.  Follow-up in 12 months.  This note was generated in part or whole with voice recognition software. Voice recognition is usually quite accurate but there are transcription errors that can and very often do occur. I apologize for any typographical errors that were not detected and corrected.  Medication Adjustments/Labs and Tests Ordered: Current medicines are reviewed at length with the patient today.  Concerns regarding medicines are outlined above.  Orders Placed This Encounter  Procedures  . EKG 12-Lead   No orders of the defined types were placed in this encounter.   Patient Instructions  Medication Instructions:  No changes  *If you need a refill on your cardiac medications before your next appointment, please call your pharmacy*   Lab Work: None  If you have labs (blood work) drawn today and your tests are completely normal, you will receive your results only by: Marland Kitchen MyChart Message (if you have MyChart) OR . A paper copy in the mail If you have any lab test that is abnormal or we need to change your treatment, we will call you to review the results.   Testing/Procedures: None   Follow-Up: At [facility], you and your health needs are our priority.  As part of our continuing mission to provide you with exceptional heart care, we have created designated Provider Care Teams.  These Care Teams include your primary Cardiologist (physician) and Advanced Practice Providers (APPs -  Physician Assistants and Nurse Practitioners) who all work together to provide you with the care you need, when you need it.  We recommend signing up for the patient portal called "MyChart".  Sign up information is provided on this After Visit Summary.  MyChart is used to connect with patients for Virtual Visits (Telemedicine).  Patients are able  to view lab/test results, encounter notes, upcoming appointments, etc.  Non-urgent messages can be sent to your provider as well.   To learn more about what you can do with MyChart, go to [email or web address].    Your next appointment:   12 month(s)  The format for your next appointment:   In Person  Provider:    You may see Debbe Odea, MD or one of the following Advanced Practice Providers on your designated Care Team:    Nicolasa Ducking, NP  Eula Listen, PA-C  Marisue Ivan, PA-C      Signed, Debbe Odea, MD  07/21/2019 9:43 AM    [facility]

## 2019-09-06 DIAGNOSIS — M109 Gout, unspecified: Secondary | ICD-10-CM | POA: Diagnosis not present

## 2019-09-06 DIAGNOSIS — Z1389 Encounter for screening for other disorder: Secondary | ICD-10-CM | POA: Diagnosis not present

## 2019-09-06 DIAGNOSIS — I10 Essential (primary) hypertension: Secondary | ICD-10-CM | POA: Diagnosis not present

## 2019-10-10 ENCOUNTER — Other Ambulatory Visit: Payer: Self-pay | Admitting: Cardiology

## 2019-10-10 ENCOUNTER — Other Ambulatory Visit: Payer: Self-pay

## 2019-10-10 MED ORDER — ATORVASTATIN CALCIUM 20 MG PO TABS: 20.0000 mg | ORAL_TABLET | Freq: Every day | ORAL | 1 refills | Status: DC

## 2020-03-19 ENCOUNTER — Other Ambulatory Visit: Payer: Self-pay | Admitting: Family

## 2020-05-20 ENCOUNTER — Telehealth: Payer: Self-pay | Admitting: Cardiology

## 2020-05-20 ENCOUNTER — Other Ambulatory Visit: Payer: Self-pay | Admitting: Cardiology

## 2020-05-20 MED ORDER — ATORVASTATIN CALCIUM 20 MG PO TABS: 20.0000 mg | ORAL_TABLET | Freq: Every day | ORAL | 1 refills | Status: DC

## 2020-05-20 NOTE — Telephone Encounter (Signed)
Received fax from [facility] requesting refills for Atorvastatin 20 mg. Rx request sent to pharmacy.

## 2020-06-20 ENCOUNTER — Other Ambulatory Visit: Payer: Self-pay

## 2020-06-20 MED ORDER — BENAZEPRIL HCL 40 MG PO TABS
40.0000 mg | ORAL_TABLET | Freq: Every day | ORAL | 0 refills | Status: DC
  Filled 2020-06-20: qty 90, 90d supply, fill #0

## 2020-06-24 ENCOUNTER — Other Ambulatory Visit: Payer: Self-pay

## 2020-06-27 DIAGNOSIS — Z125 Encounter for screening for malignant neoplasm of prostate: Secondary | ICD-10-CM | POA: Diagnosis not present

## 2020-06-27 DIAGNOSIS — E119 Type 2 diabetes mellitus without complications: Secondary | ICD-10-CM | POA: Diagnosis not present

## 2020-06-27 DIAGNOSIS — F17200 Nicotine dependence, unspecified, uncomplicated: Secondary | ICD-10-CM | POA: Diagnosis not present

## 2020-06-27 DIAGNOSIS — M109 Gout, unspecified: Secondary | ICD-10-CM | POA: Diagnosis not present

## 2020-06-27 DIAGNOSIS — I10 Essential (primary) hypertension: Secondary | ICD-10-CM | POA: Diagnosis not present

## 2020-06-27 DIAGNOSIS — E785 Hyperlipidemia, unspecified: Secondary | ICD-10-CM | POA: Diagnosis not present

## 2020-06-28 ENCOUNTER — Other Ambulatory Visit: Payer: Self-pay

## 2020-06-28 MED ORDER — BENAZEPRIL HCL 40 MG PO TABS
40.0000 mg | ORAL_TABLET | Freq: Every day | ORAL | 2 refills | Status: DC
  Filled 2020-06-28 – 2020-09-19 (×2): qty 90, 90d supply, fill #0
  Filled 2020-12-19: qty 90, 90d supply, fill #1
  Filled 2021-03-18: qty 90, 90d supply, fill #2

## 2020-06-28 MED ORDER — HYDROCHLOROTHIAZIDE 25 MG PO TABS
1.0000 | ORAL_TABLET | Freq: Every day | ORAL | 2 refills | Status: DC
  Filled 2020-06-28: qty 90, 90d supply, fill #0
  Filled 2020-09-30: qty 90, 90d supply, fill #1
  Filled 2020-12-19: qty 90, 90d supply, fill #2

## 2020-06-28 MED ORDER — ATORVASTATIN CALCIUM 20 MG PO TABS
ORAL_TABLET | ORAL | 2 refills | Status: DC
  Filled 2020-06-28 – 2020-12-10 (×2): qty 90, 90d supply, fill #0
  Filled 2021-03-13: qty 90, 90d supply, fill #1
  Filled 2021-06-11: qty 90, 90d supply, fill #2

## 2020-06-28 MED ORDER — AMLODIPINE BESYLATE 2.5 MG PO TABS
ORAL_TABLET | ORAL | 2 refills | Status: DC
  Filled 2020-06-28: qty 90, 90d supply, fill #0
  Filled 2020-12-10: qty 90, 90d supply, fill #1
  Filled 2021-03-13: qty 90, 90d supply, fill #2

## 2020-06-28 MED ORDER — DICLOFENAC SODIUM 1 % EX GEL
CUTANEOUS | 2 refills | Status: DC
  Filled 2020-06-28: qty 100, 12d supply, fill #0

## 2020-06-28 MED ORDER — METFORMIN HCL 850 MG PO TABS
850.0000 mg | ORAL_TABLET | Freq: Two times a day (BID) | ORAL | 2 refills | Status: DC
  Filled 2020-06-28: qty 180, 90d supply, fill #0

## 2020-06-28 MED ORDER — INDOMETHACIN 25 MG PO CAPS
ORAL_CAPSULE | ORAL | 2 refills | Status: DC
  Filled 2020-06-28: qty 30, 10d supply, fill #0
  Filled 2021-03-04: qty 30, 10d supply, fill #1
  Filled 2021-06-09: qty 30, 10d supply, fill #2

## 2020-07-09 ENCOUNTER — Encounter: Payer: Self-pay | Admitting: Physician Assistant

## 2020-08-22 ENCOUNTER — Other Ambulatory Visit: Payer: Self-pay

## 2020-08-22 MED FILL — Atorvastatin Calcium Tab 20 MG (Base Equivalent): ORAL | 90 days supply | Qty: 90 | Fill #0 | Status: AC

## 2020-08-28 ENCOUNTER — Telehealth: Payer: 59

## 2020-09-19 ENCOUNTER — Other Ambulatory Visit: Payer: Self-pay

## 2020-09-30 ENCOUNTER — Other Ambulatory Visit: Payer: Self-pay

## 2020-12-10 ENCOUNTER — Other Ambulatory Visit: Payer: Self-pay

## 2020-12-19 ENCOUNTER — Other Ambulatory Visit: Payer: Self-pay

## 2021-03-04 ENCOUNTER — Other Ambulatory Visit: Payer: Self-pay

## 2021-03-13 ENCOUNTER — Other Ambulatory Visit: Payer: Self-pay

## 2021-03-18 ENCOUNTER — Other Ambulatory Visit: Payer: Self-pay

## 2021-04-07 ENCOUNTER — Other Ambulatory Visit: Payer: Self-pay

## 2021-04-07 DIAGNOSIS — S39011A Strain of muscle, fascia and tendon of abdomen, initial encounter: Secondary | ICD-10-CM | POA: Diagnosis not present

## 2021-04-07 MED ORDER — PREDNISONE 10 MG PO TABS
ORAL_TABLET | ORAL | 0 refills | Status: DC
  Filled 2021-04-07: qty 21, 6d supply, fill #0

## 2021-04-08 ENCOUNTER — Other Ambulatory Visit: Payer: Self-pay

## 2021-04-08 MED ORDER — HYDROCHLOROTHIAZIDE 25 MG PO TABS
25.0000 mg | ORAL_TABLET | Freq: Every day | ORAL | 0 refills | Status: DC
  Filled 2021-04-08: qty 90, 90d supply, fill #0

## 2021-06-09 ENCOUNTER — Other Ambulatory Visit: Payer: Self-pay

## 2021-06-11 ENCOUNTER — Other Ambulatory Visit: Payer: Self-pay

## 2021-06-11 MED ORDER — AMLODIPINE BESYLATE 2.5 MG PO TABS
ORAL_TABLET | ORAL | 0 refills | Status: DC
  Filled 2021-06-11: qty 30, 30d supply, fill #0

## 2021-06-17 ENCOUNTER — Other Ambulatory Visit: Payer: Self-pay

## 2021-06-18 ENCOUNTER — Other Ambulatory Visit: Payer: Self-pay

## 2021-06-18 MED ORDER — AMLODIPINE BESYLATE 2.5 MG PO TABS
ORAL_TABLET | ORAL | 0 refills | Status: DC
  Filled 2021-06-18: qty 30, 30d supply, fill #0

## 2021-06-18 MED ORDER — BENAZEPRIL HCL 40 MG PO TABS
40.0000 mg | ORAL_TABLET | Freq: Every day | ORAL | 0 refills | Status: DC
  Filled 2021-06-18: qty 30, 30d supply, fill #0

## 2021-06-19 ENCOUNTER — Other Ambulatory Visit: Payer: Self-pay

## 2021-07-08 ENCOUNTER — Other Ambulatory Visit: Payer: Self-pay

## 2021-07-10 ENCOUNTER — Other Ambulatory Visit: Payer: Self-pay

## 2021-07-11 ENCOUNTER — Other Ambulatory Visit: Payer: Self-pay

## 2021-07-17 ENCOUNTER — Other Ambulatory Visit: Payer: Self-pay

## 2021-07-18 ENCOUNTER — Other Ambulatory Visit: Payer: Self-pay

## 2021-07-22 ENCOUNTER — Other Ambulatory Visit: Payer: Self-pay

## 2021-07-24 ENCOUNTER — Other Ambulatory Visit: Payer: Self-pay

## 2021-07-24 MED ORDER — AMLODIPINE BESYLATE 2.5 MG PO TABS
ORAL_TABLET | ORAL | 0 refills | Status: DC
  Filled 2021-07-24: qty 30, 30d supply, fill #0

## 2021-07-24 MED ORDER — BENAZEPRIL HCL 40 MG PO TABS
40.0000 mg | ORAL_TABLET | Freq: Every day | ORAL | 0 refills | Status: DC
  Filled 2021-07-24: qty 30, 30d supply, fill #0

## 2021-07-25 ENCOUNTER — Other Ambulatory Visit: Payer: Self-pay

## 2021-07-25 DIAGNOSIS — Z0130 Encounter for examination of blood pressure without abnormal findings: Secondary | ICD-10-CM | POA: Diagnosis not present

## 2021-07-25 DIAGNOSIS — M109 Gout, unspecified: Secondary | ICD-10-CM | POA: Diagnosis not present

## 2021-07-25 DIAGNOSIS — Z125 Encounter for screening for malignant neoplasm of prostate: Secondary | ICD-10-CM | POA: Diagnosis not present

## 2021-07-25 DIAGNOSIS — Z0131 Encounter for examination of blood pressure with abnormal findings: Secondary | ICD-10-CM | POA: Diagnosis not present

## 2021-07-25 DIAGNOSIS — F17200 Nicotine dependence, unspecified, uncomplicated: Secondary | ICD-10-CM | POA: Diagnosis not present

## 2021-07-25 DIAGNOSIS — E119 Type 2 diabetes mellitus without complications: Secondary | ICD-10-CM | POA: Diagnosis not present

## 2021-07-25 DIAGNOSIS — I10 Essential (primary) hypertension: Secondary | ICD-10-CM | POA: Diagnosis not present

## 2021-07-25 DIAGNOSIS — E785 Hyperlipidemia, unspecified: Secondary | ICD-10-CM | POA: Diagnosis not present

## 2021-07-25 DIAGNOSIS — Z1389 Encounter for screening for other disorder: Secondary | ICD-10-CM | POA: Diagnosis not present

## 2021-07-25 MED ORDER — COLCHICINE 0.6 MG PO CAPS
ORAL_CAPSULE | ORAL | 3 refills | Status: DC
  Filled 2021-07-25: qty 30, 30d supply, fill #0

## 2021-07-25 MED ORDER — BENAZEPRIL HCL 40 MG PO TABS
40.0000 mg | ORAL_TABLET | Freq: Every day | ORAL | 2 refills | Status: DC
  Filled 2021-07-25: qty 90, 90d supply, fill #0

## 2021-07-25 MED ORDER — AMLODIPINE BESYLATE 2.5 MG PO TABS
ORAL_TABLET | ORAL | 2 refills | Status: DC
  Filled 2021-07-25: qty 90, 90d supply, fill #0

## 2021-07-25 MED ORDER — INDOMETHACIN 25 MG PO CAPS
ORAL_CAPSULE | ORAL | 2 refills | Status: DC
Start: 2021-07-25 — End: 2021-10-07
  Filled 2021-07-25: qty 30, 10d supply, fill #0

## 2021-07-25 MED ORDER — ATORVASTATIN CALCIUM 20 MG PO TABS
ORAL_TABLET | ORAL | 2 refills | Status: DC
  Filled 2021-07-25 – 2021-09-10 (×2): qty 90, 90d supply, fill #0

## 2021-07-25 MED ORDER — HYDROCHLOROTHIAZIDE 25 MG PO TABS
25.0000 mg | ORAL_TABLET | Freq: Every day | ORAL | 2 refills | Status: DC
  Filled 2021-07-25: qty 90, 90d supply, fill #0

## 2021-07-25 MED ORDER — METFORMIN HCL 850 MG PO TABS
850.0000 mg | ORAL_TABLET | Freq: Two times a day (BID) | ORAL | 2 refills | Status: DC
  Filled 2021-07-25: qty 156, 78d supply, fill #0
  Filled 2021-07-25: qty 24, 12d supply, fill #0

## 2021-07-25 MED ORDER — COLCHICINE 0.6 MG PO TABS
ORAL_TABLET | ORAL | 3 refills | Status: DC
  Filled 2021-07-25: qty 6, 5d supply, fill #0

## 2021-07-29 ENCOUNTER — Other Ambulatory Visit: Payer: Self-pay

## 2021-07-30 ENCOUNTER — Other Ambulatory Visit: Payer: Self-pay

## 2021-07-31 ENCOUNTER — Ambulatory Visit: Payer: 59 | Admitting: Cardiology

## 2021-07-31 ENCOUNTER — Other Ambulatory Visit: Payer: Self-pay

## 2021-07-31 ENCOUNTER — Encounter: Payer: Self-pay | Admitting: Cardiology

## 2021-07-31 VITALS — BP 164/80 | HR 55 | Ht 70.5 in | Wt 201.0 lb

## 2021-07-31 DIAGNOSIS — I10 Essential (primary) hypertension: Secondary | ICD-10-CM

## 2021-07-31 DIAGNOSIS — F17200 Nicotine dependence, unspecified, uncomplicated: Secondary | ICD-10-CM

## 2021-07-31 DIAGNOSIS — E7800 Pure hypercholesterolemia, unspecified: Secondary | ICD-10-CM | POA: Diagnosis not present

## 2021-07-31 MED ORDER — AMLODIPINE BESYLATE 2.5 MG PO TABS
ORAL_TABLET | ORAL | 3 refills | Status: DC
  Filled 2021-07-31: qty 30, fill #0
  Filled 2021-08-21: qty 30, 30d supply, fill #0
  Filled 2021-09-22: qty 90, 90d supply, fill #1

## 2021-07-31 MED ORDER — BENAZEPRIL HCL 40 MG PO TABS
40.0000 mg | ORAL_TABLET | Freq: Every day | ORAL | 3 refills | Status: DC
  Filled 2021-07-31 – 2021-08-21 (×2): qty 30, 30d supply, fill #0
  Filled 2021-09-22: qty 90, 90d supply, fill #1

## 2021-07-31 MED ORDER — HYDROCHLOROTHIAZIDE 25 MG PO TABS
25.0000 mg | ORAL_TABLET | Freq: Every day | ORAL | 3 refills | Status: DC
  Filled 2021-07-31: qty 30, 30d supply, fill #0

## 2021-07-31 NOTE — Progress Notes (Signed)
Cardiology Office Note:    Date:  07/31/2021   ID:  ALLEN EGERTON, DOB [birth year], MRN [id number]  PCP:  Gildardo Pounds, PA  Cardiologist:  Debbe Odea, MD  Electrophysiologist:  None   Referring MD: Gildardo Pounds, PA   No chief complaint on file.   History of Present Illness:    Jack Richards is a 61 y.o. male with a hx of hypertension, hyperlipidemia, current smoker presents for an overdue follow-up.    Previously seen about a year ago for hypertension and medication management.  States not taking HCTZ as prescribed.  Compliant with amlodipine 2.5 and benazepril 40.  He still smokes.  States blood pressure usually elevated in the mornings.   Prior notes Echo 10/2019 showed normal systolic and diastolic function, EF 60 to 65%.   Has a family history of CAD in the father.  Also has family history of aortic aneurysm in the father.     Past Medical History:  Diagnosis Date   Hyperlipidemia    Hypertension     Past Surgical History:  Procedure Laterality Date   EYE SURGERY     LEG SURGERY     right leg has rods and pins     Current Medications: Current Meds  Medication Sig   acetaminophen (TYLENOL) 500 MG tablet Take 500 mg by mouth every 6 (six) hours as needed.   aspirin EC 81 MG tablet Take 81 mg by mouth daily.   atorvastatin (LIPITOR) 20 MG tablet 1 tablet by mouth every night   buPROPion (WELLBUTRIN XL) 150 MG 24 hr tablet Take 150 mg by mouth daily.    colchicine 0.6 MG tablet TAKE 2 TABLETS BY MOUTH ON FIRST DAY, THEN 1 TABLET BY MOUTH THE NEXT 4 DAYS AS DIRECTED.   diclofenac Sodium (VOLTAREN) 1 % GEL Apply to affected joint (2gm UE or 4gm LE) up to 4 times daily for pain, do not exceed 8 gm per UE joint or 16 gm for LE joint per day   indomethacin (INDOCIN) 25 MG capsule Take 1 capsule by mouth three times a day as needed for pain   metFORMIN (GLUCOPHAGE) 850 MG tablet TAKE 1 TABLET BY MOUTH 2 TIMES A DAY.   [DISCONTINUED] amLODipine (NORVASC)  2.5 MG tablet Take one tablet by mouth once a day for high blood pressure. Decrease HCTZ 12.5 mg (No more refills until seen)   [DISCONTINUED] amLODipine (NORVASC) 2.5 MG tablet Take 1 tablet by mouth once a day 1 tablet daily for high blood pressure.  decrease HCTZ 12.5 mg   [DISCONTINUED] amLODipine (NORVASC) 2.5 MG tablet Take 1 tablet by mouth once daily for high blood pressure   [DISCONTINUED] benazepril (LOTENSIN) 40 MG tablet Take 1 tablet (40 mg total) by mouth daily.   [DISCONTINUED] benazepril (LOTENSIN) 40 MG tablet Take 1 tablet by mouth once a day   [DISCONTINUED] benazepril (LOTENSIN) 40 MG tablet Take 1 tablet by mouth once a day   [DISCONTINUED] Colchicine 0.6 MG CAPS Take 2 capsule by mouth single dose at first sign of flare then after one hour take 1 capsule   [DISCONTINUED] hydrochlorothiazide (HYDRODIURIL) 25 MG tablet TAKE 1 TABLET BY MOUTH ONCE DAILY FOR BLOOD PRESSURE.   [DISCONTINUED] hydrochlorothiazide (HYDRODIURIL) 25 MG tablet TAKE 1 TABLET BY MOUTH ONCE DAILY FOR BLOOD PRESSURE.   [DISCONTINUED] hydrochlorothiazide (HYDRODIURIL) 25 MG tablet TAKE 1 TABLET BY MOUTH ONCE DAILY FOR BLOOD PRESSURE.   [DISCONTINUED] metFORMIN (GLUCOPHAGE) 850 MG tablet TAKE 1 TABLET  BY MOUTH 2 TIMES A DAY.   [DISCONTINUED] predniSONE (DELTASONE) 10 MG tablet Take 6 tablets by mouth on day 1, then decrease by one tablet each day     Allergies:   Patient has no known allergies.   Social History   Socioeconomic History   Marital status: Married    Spouse name: Not on file   Number of children: Not on file   Years of education: Not on file   Highest education level: Not on file  Occupational History   Not on file  Tobacco Use   Smoking status: Every Day    Packs/day: 1.00    Years: 7.00    Total pack years: 7.00    Types: Cigarettes   Smokeless tobacco: Never  Vaping Use   Vaping Use: Never used  Substance and Sexual Activity   Alcohol use: Yes    Comment: social    Drug use:  Never   Sexual activity: Not on file  Other Topics Concern   Not on file  Social History Narrative   Not on file   Social Determinants of Health   Financial Resource Strain: Not on file  Food Insecurity: Not on file  Transportation Needs: Not on file  Physical Activity: Not on file  Stress: Not on file  Social Connections: Not on file     Family History: The patient's family history includes ALS in his mother; Heart attack in his father; Heart disease in his brother; Heart disease (age of onset: 3261) in his father; Hyperlipidemia in his father; Hypertension in his father.  ROS:   Please see the history of present illness.     All other systems reviewed and are negative.  EKGs/Labs/Other Studies Reviewed:    The following studies were reviewed today:   EKG:  EKG is  ordered today.  The ekg ordered today demonstrates normal sinus rhythm, normal ECG.  Recent Labs: No results found for requested labs within last 365 days.  Recent Lipid Panel No results found for: "CHOL", "TRIG", "HDL", "CHOLHDL", "VLDL", "LDLCALC", "LDLDIRECT"  Outside cholesterol/lipid panel obtained 06/27/2020: Total cholesterol 145, LDL cholesterol 66, triglycerides 108, HDL cholesterol 60.  Physical Exam:    VS:  BP (!) 164/80   Pulse (!) 55   Ht 5' 10.5" (1.791 m)   Wt 201 lb (91.2 kg)   SpO2 97%   BMI 28.43 kg/m     Wt Readings from Last 3 Encounters:  07/31/21 201 lb (91.2 kg)  07/21/19 198 lb 8 oz (90 kg)  04/21/19 200 lb 8 oz (90.9 kg)     GEN:  Well nourished, well developed in no acute distress HEENT: Normal NECK: No JVD; No carotid bruits LYMPHATICS: No lymphadenopathy CARDIAC: RRR, no murmurs, rubs, gallops RESPIRATORY:  Clear to auscultation without rales, wheezing or rhonchi  ABDOMEN: Soft, non-tender, non-distended MUSCULOSKELETAL:  No edema; No deformity  SKIN: Warm and dry NEUROLOGIC:  Alert and oriented x 3 PSYCHIATRIC:  Normal affect   ASSESSMENT:    1. Primary  hypertension   2. Pure hypercholesterolemia   3. Smoking      PLAN:    In order of problems listed above:  Hypertension, BP not controlled likely from medication noncompliance.  Advised to start taking HCTZ 25 mg daily as prescribed.  Continue amlodipine 2.5 mg, benazepril 40.  If BP stays elevated at follow-up visit, titrate amlodipine.   Hyperlipidemia, cholesterol controlled.  Continue Lipitor 20. Current smoker, smoking cessation again advised.  Follow-up in 2  to 3 months.  This note was generated in part or whole with voice recognition software. Voice recognition is usually quite accurate but there are transcription errors that can and very often do occur. I apologize for any typographical errors that were not detected and corrected.  Medication Adjustments/Labs and Tests Ordered: Current medicines are reviewed at length with the patient today.  Concerns regarding medicines are outlined above.  Orders Placed This Encounter  Procedures   EKG 12-Lead   Meds ordered this encounter  Medications   hydrochlorothiazide (HYDRODIURIL) 25 MG tablet    Sig: Take 1 tablet (25 mg total) by mouth daily.    Dispense:  30 tablet    Refill:  3   amLODipine (NORVASC) 2.5 MG tablet    Sig: Take 1 tablet by mouth once daily for high blood pressure    Dispense:  30 tablet    Refill:  3   benazepril (LOTENSIN) 40 MG tablet    Sig: Take 1 tablet by mouth once a day    Dispense:  30 tablet    Refill:  3    Patient Instructions  Medication Instructions:   MAKE SURE YOU TAKE YOUR:  Hydrochlorothiazide 25 MG once a day.  Benazepril 40 MG once a day.  Amlodipine 2.5 MG once a day.    *If you need a refill on your cardiac medications before your next appointment, please call your pharmacy*   Follow-Up: At [facility], you and your health needs are our priority.  As part of our continuing mission to provide you with exceptional heart care, we have created designated Provider Care  Teams.  These Care Teams include your primary Cardiologist (physician) and Advanced Practice Providers (APPs -  Physician Assistants and Nurse Practitioners) who all work together to provide you with the care you need, when you need it.  We recommend signing up for the patient portal called "MyChart".  Sign up information is provided on this After Visit Summary.  MyChart is used to connect with patients for Virtual Visits (Telemedicine).  Patients are able to view lab/test results, encounter notes, upcoming appointments, etc.  Non-urgent messages can be sent to your provider as well.   To learn more about what you can do with MyChart, go to [email or web address].    Your next appointment:   2-3 month(s)  The format for your next appointment:   In Person  Provider:   You may see Debbe Odea, MD or one of the following Advanced Practice Providers on your designated Care Team:   Nicolasa Ducking, NP Eula Listen, PA-C Cadence Fransico Michael, [place]    Other Instructions   Important Information About Sugar         Signed, Debbe Odea, MD  07/31/2021 9:06 AM    [facility]

## 2021-07-31 NOTE — Patient Instructions (Signed)
Medication Instructions:   MAKE SURE YOU TAKE YOUR:  Hydrochlorothiazide 25 MG once a day.  Benazepril 40 MG once a day.  Amlodipine 2.5 MG once a day.    *If you need a refill on your cardiac medications before your next appointment, please call your pharmacy*   Follow-Up: At [facility], you and your health needs are our priority.  As part of our continuing mission to provide you with exceptional heart care, we have created designated Provider Care Teams.  These Care Teams include your primary Cardiologist (physician) and Advanced Practice Providers (APPs -  Physician Assistants and Nurse Practitioners) who all work together to provide you with the care you need, when you need it.  We recommend signing up for the patient portal called "MyChart".  Sign up information is provided on this After Visit Summary.  MyChart is used to connect with patients for Virtual Visits (Telemedicine).  Patients are able to view lab/test results, encounter notes, upcoming appointments, etc.  Non-urgent messages can be sent to your provider as well.   To learn more about what you can do with MyChart, go to [email or web address].    Your next appointment:   2-3 month(s)  The format for your next appointment:   In Person  Provider:   You may see Debbe Odea, MD or one of the following Advanced Practice Providers on your designated Care Team:   Nicolasa Ducking, NP Eula Listen, PA-C Cadence Fransico Michael, [place]    Other Instructions   Important Information About Sugar

## 2021-08-21 ENCOUNTER — Other Ambulatory Visit: Payer: Self-pay

## 2021-09-10 ENCOUNTER — Other Ambulatory Visit: Payer: Self-pay

## 2021-09-22 ENCOUNTER — Other Ambulatory Visit: Payer: Self-pay

## 2021-09-30 ENCOUNTER — Other Ambulatory Visit: Payer: Self-pay

## 2021-09-30 ENCOUNTER — Ambulatory Visit: Payer: 59 | Admitting: Cardiology

## 2021-10-07 ENCOUNTER — Encounter: Payer: Self-pay | Admitting: Cardiology

## 2021-10-07 ENCOUNTER — Ambulatory Visit (INDEPENDENT_AMBULATORY_CARE_PROVIDER_SITE_OTHER): Payer: 59 | Admitting: Cardiology

## 2021-10-07 ENCOUNTER — Other Ambulatory Visit: Payer: Self-pay

## 2021-10-07 VITALS — BP 116/70 | HR 84 | Ht 70.0 in | Wt 199.4 lb

## 2021-10-07 DIAGNOSIS — I10 Essential (primary) hypertension: Secondary | ICD-10-CM | POA: Diagnosis not present

## 2021-10-07 DIAGNOSIS — E7800 Pure hypercholesterolemia, unspecified: Secondary | ICD-10-CM

## 2021-10-07 DIAGNOSIS — F17200 Nicotine dependence, unspecified, uncomplicated: Secondary | ICD-10-CM | POA: Diagnosis not present

## 2021-10-07 DIAGNOSIS — IMO0001 Reserved for inherently not codable concepts without codable children: Secondary | ICD-10-CM

## 2021-10-07 MED ORDER — AMLODIPINE BESYLATE 2.5 MG PO TABS
ORAL_TABLET | ORAL | 3 refills | Status: DC
  Filled 2021-10-07: qty 90, fill #0
  Filled 2021-12-19: qty 90, 90d supply, fill #0
  Filled 2022-03-19: qty 90, 90d supply, fill #1
  Filled 2022-07-07: qty 90, 90d supply, fill #2
  Filled 2022-10-05: qty 90, 90d supply, fill #3

## 2021-10-07 MED ORDER — DICLOFENAC SODIUM 1 % EX GEL
CUTANEOUS | 2 refills | Status: DC
  Filled 2021-10-07: qty 100, 5d supply, fill #0

## 2021-10-07 MED ORDER — BENAZEPRIL HCL 40 MG PO TABS
40.0000 mg | ORAL_TABLET | Freq: Every day | ORAL | 3 refills | Status: DC
  Filled 2021-10-07: qty 90, 90d supply, fill #0
  Filled 2021-12-19: qty 41, 41d supply, fill #0
  Filled 2021-12-19: qty 49, 49d supply, fill #0
  Filled 2022-02-09 – 2022-03-19 (×2): qty 90, 90d supply, fill #1
  Filled 2022-06-19: qty 90, 90d supply, fill #2
  Filled 2022-10-05: qty 90, 90d supply, fill #3

## 2021-10-07 MED ORDER — COLCHICINE 0.6 MG PO TABS
ORAL_TABLET | ORAL | 3 refills | Status: DC
  Filled 2021-10-07: qty 30, 30d supply, fill #0
  Filled 2022-10-05: qty 30, 30d supply, fill #1

## 2021-10-07 MED ORDER — HYDROCHLOROTHIAZIDE 25 MG PO TABS
25.0000 mg | ORAL_TABLET | Freq: Every day | ORAL | 3 refills | Status: DC
  Filled 2021-10-07: qty 90, 90d supply, fill #0
  Filled 2022-01-27: qty 90, 90d supply, fill #1
  Filled 2022-04-28: qty 90, 90d supply, fill #2
  Filled 2022-08-25: qty 90, 90d supply, fill #3

## 2021-10-07 MED ORDER — ATORVASTATIN CALCIUM 20 MG PO TABS
ORAL_TABLET | ORAL | 3 refills | Status: DC
  Filled 2021-10-07: qty 90, fill #0
  Filled 2021-12-19: qty 90, 90d supply, fill #0
  Filled 2022-03-19: qty 90, 90d supply, fill #1
  Filled 2022-06-19: qty 90, 90d supply, fill #2
  Filled 2022-10-05: qty 90, 90d supply, fill #3

## 2021-10-07 MED ORDER — INDOMETHACIN 25 MG PO CAPS
ORAL_CAPSULE | ORAL | 2 refills | Status: DC
  Filled 2021-10-07: qty 90, 90d supply, fill #0
  Filled 2022-03-31: qty 80, 27d supply, fill #1
  Filled 2022-03-31: qty 10, 3d supply, fill #1
  Filled 2022-04-24: qty 80, 27d supply, fill #1
  Filled 2022-04-24: qty 10, 3d supply, fill #1
  Filled 2022-10-05: qty 38, 13d supply, fill #2
  Filled 2022-10-05: qty 52, 17d supply, fill #2

## 2021-10-07 NOTE — Progress Notes (Signed)
Cardiology Office Note:    Date:  10/07/2021   ID:  Jack Richards, DOB 1960-05-31, MRN [id number]  PCP:  Gildardo Pounds, PA  Cardiologist:  Debbe Odea, MD  Electrophysiologist:  None   Referring MD: Gildardo Pounds, PA   Chief Complaint  Patient presents with   Follow-up    2-3 month follow up. Patient states that he is feeling great. Meds reviewed with patient.     History of Present Illness:    Jack Richards is a 61 y.o. male with a hx of hypertension, hyperlipidemia, current smoker presents for follow-up.  Previously seen for elevated blood pressures in the setting of not taking medications as prescribed.  Medication compliance was advised, he states taking all meds.  Feels well, denies any adverse effects from medications.  Trying to find a primary care provider in the area/within [place] system.  He still smokes.   Prior notes Echo 10/2019 showed normal systolic and diastolic function, EF 60 to 65%.   Has a family history of CAD in the father.  Also has family history of aortic aneurysm in the father.     Past Medical History:  Diagnosis Date   Hyperlipidemia    Hypertension     Past Surgical History:  Procedure Laterality Date   EYE SURGERY     LEG SURGERY     right leg has rods and pins     Current Medications: Current Meds  Medication Sig   acetaminophen (TYLENOL) 500 MG tablet Take 500 mg by mouth every 6 (six) hours as needed.   aspirin EC 81 MG tablet Take 81 mg by mouth daily.   [DISCONTINUED] amLODipine (NORVASC) 2.5 MG tablet Take 1 tablet by mouth once daily for high blood pressure   [DISCONTINUED] atorvastatin (LIPITOR) 20 MG tablet 1 tablet by mouth every night   [DISCONTINUED] benazepril (LOTENSIN) 40 MG tablet Take 1 tablet by mouth once a day   [DISCONTINUED] colchicine 0.6 MG tablet TAKE 2 TABLETS BY MOUTH ON FIRST DAY, THEN 1 TABLET BY MOUTH THE NEXT 4 DAYS AS DIRECTED.   [DISCONTINUED] diclofenac Sodium (VOLTAREN) 1 % GEL  Apply to affected joint (2gm UE or 4gm LE) up to 4 times daily for pain, do not exceed 8 gm per UE joint or 16 gm for LE joint per day   [DISCONTINUED] hydrochlorothiazide (HYDRODIURIL) 25 MG tablet Take 1 tablet (25 mg total) by mouth daily.   [DISCONTINUED] indomethacin (INDOCIN) 25 MG capsule Take 1 capsule by mouth three times a day as needed for pain     Allergies:   Patient has no known allergies.   Social History   Socioeconomic History   Marital status: Married    Spouse name: Not on file   Number of children: Not on file   Years of education: Not on file   Highest education level: Not on file  Occupational History   Not on file  Tobacco Use   Smoking status: Every Day    Packs/day: 1.00    Years: 7.00    Total pack years: 7.00    Types: Cigarettes   Smokeless tobacco: Never  Vaping Use   Vaping Use: Never used  Substance and Sexual Activity   Alcohol use: Yes    Comment: social    Drug use: Never   Sexual activity: Not on file  Other Topics Concern   Not on file  Social History Narrative   Not on file   Social Determinants  of Health   Financial Resource Strain: Not on file  Food Insecurity: Not on file  Transportation Needs: Not on file  Physical Activity: Not on file  Stress: Not on file  Social Connections: Not on file     Family History: The patient's family history includes ALS in his mother; Heart attack in his father; Heart disease in his brother; Heart disease (age of onset: 47) in his father; Hyperlipidemia in his father; Hypertension in his father.  ROS:   Please see the history of present illness.     All other systems reviewed and are negative.  EKGs/Labs/Other Studies Reviewed:    The following studies were reviewed today:   EKG:  EKG not ordered today.    Recent Labs: No results found for requested labs within last 365 days.  Recent Lipid Panel No results found for: "CHOL", "TRIG", "HDL", "CHOLHDL", "VLDL", "LDLCALC",  "LDLDIRECT"  Outside cholesterol/lipid panel obtained 06/27/2020: Total cholesterol 145, LDL cholesterol 66, triglycerides 108, HDL cholesterol 60.  Physical Exam:    VS:  BP 116/70 (BP Location: Left Arm, Patient Position: Sitting, Cuff Size: Normal)   Pulse 84   Ht 5\' 10"  (1.778 m)   Wt 199 lb 6.4 oz (90.4 kg)   SpO2 96%   BMI 28.61 kg/m     Wt Readings from Last 3 Encounters:  10/07/21 199 lb 6.4 oz (90.4 kg)  07/31/21 201 lb (91.2 kg)  07/21/19 198 lb 8 oz (90 kg)     GEN:  Well nourished, well developed in no acute distress HEENT: Normal NECK: No JVD; No carotid bruits CARDIAC: RRR, no murmurs, rubs, gallops RESPIRATORY:  Clear to auscultation without rales, wheezing or rhonchi  ABDOMEN: Soft, non-tender, non-distended MUSCULOSKELETAL:  No edema; No deformity  SKIN: Warm and dry NEUROLOGIC:  Alert and oriented x 3 PSYCHIATRIC:  Normal affect   ASSESSMENT:    1. Primary hypertension   2. Pure hypercholesterolemia   3. Smoking     PLAN:    In order of problems listed above:  Hypertension, BP now controlled.  Continue HCTZ 25 mg daily, benazepril 40 mg daily, amlodipine 2.5 mg. Hyperlipidemia, cholesterol controlled.  Continue Lipitor 20. Current smoker, smoking cessation again advised.  Follow-up as needed  This note was generated in part or whole with voice recognition software. Voice recognition is usually quite accurate but there are transcription errors that can and very often do occur. I apologize for any typographical errors that were not detected and corrected.  Medication Adjustments/Labs and Tests Ordered: Current medicines are reviewed at length with the patient today.  Concerns regarding medicines are outlined above.  No orders of the defined types were placed in this encounter.  Meds ordered this encounter  Medications   colchicine 0.6 MG tablet    Sig: TAKE 2 TABLETS BY MOUTH ON FIRST DAY, THEN 1 TABLET BY MOUTH THE NEXT 4 DAYS AS DIRECTED.     Dispense:  30 tablet    Refill:  3   diclofenac Sodium (VOLTAREN) 1 % GEL    Sig: Apply to affected joint (2gm UE or 4gm LE) up to 4 times daily for pain, do not exceed 8 gm per UE joint or 16 gm for LE joint per day    Dispense:  100 g    Refill:  2   indomethacin (INDOCIN) 25 MG capsule    Sig: Take 1 capsule by mouth three times a day as needed for pain    Dispense:  90  capsule    Refill:  2   benazepril (LOTENSIN) 40 MG tablet    Sig: Take 1 tablet by mouth once a day    Dispense:  90 tablet    Refill:  3   atorvastatin (LIPITOR) 20 MG tablet    Sig: 1 tablet by mouth every night    Dispense:  90 tablet    Refill:  3   amLODipine (NORVASC) 2.5 MG tablet    Sig: Take 1 tablet by mouth once daily for high blood pressure    Dispense:  90 tablet    Refill:  3   hydrochlorothiazide (HYDRODIURIL) 25 MG tablet    Sig: Take 1 tablet (25 mg total) by mouth daily.    Dispense:  90 tablet    Refill:  3    Patient Instructions  Medication Instructions:   Your physician recommends that you continue on your current medications as directed. Please refer to the Current Medication list given to you today.  *If you need a refill on your cardiac medications before your next appointment, please call your pharmacy*   Follow-Up: At [facility], you and your health needs are our priority.  As part of our continuing mission to provide you with exceptional heart care, we have created designated Provider Care Teams.  These Care Teams include your primary Cardiologist (physician) and Advanced Practice Providers (APPs -  Physician Assistants and Nurse Practitioners) who all work together to provide you with the care you need, when you need it.  We recommend signing up for the patient portal called "MyChart".  Sign up information is provided on this After Visit Summary.  MyChart is used to connect with patients for Virtual Visits (Telemedicine).  Patients are able to view lab/test results, encounter  notes, upcoming appointments, etc.  Non-urgent messages can be sent to your provider as well.   To learn more about what you can do with MyChart, go to [email or web address].    Your next appointment:   Follow up as needed   The format for your next appointment:   In Person  Provider:   Kate Sable, MD    Other Instructions   Important Information About Sugar         Signed, Kate Sable, MD  10/07/2021 8:49 AM    [place]

## 2021-10-07 NOTE — Patient Instructions (Signed)
Medication Instructions:   Your physician recommends that you continue on your current medications as directed. Please refer to the Current Medication list given to you today.  *If you need a refill on your cardiac medications before your next appointment, please call your pharmacy*   Follow-Up: At [facility], you and your health needs are our priority.  As part of our continuing mission to provide you with exceptional heart care, we have created designated Provider Care Teams.  These Care Teams include your primary Cardiologist (physician) and Advanced Practice Providers (APPs -  Physician Assistants and Nurse Practitioners) who all work together to provide you with the care you need, when you need it.  We recommend signing up for the patient portal called "MyChart".  Sign up information is provided on this After Visit Summary.  MyChart is used to connect with patients for Virtual Visits (Telemedicine).  Patients are able to view lab/test results, encounter notes, upcoming appointments, etc.  Non-urgent messages can be sent to your provider as well.   To learn more about what you can do with MyChart, go to [email or web address].    Your next appointment:    Follow up as needed   The format for your next appointment:   In Person  Provider:   Brian Agbor-Etang, MD    Other Instructions   Important Information About Sugar       

## 2021-12-19 ENCOUNTER — Other Ambulatory Visit: Payer: Self-pay

## 2022-01-27 ENCOUNTER — Other Ambulatory Visit: Payer: Self-pay

## 2022-02-09 ENCOUNTER — Other Ambulatory Visit: Payer: Self-pay

## 2022-03-19 ENCOUNTER — Other Ambulatory Visit: Payer: Self-pay

## 2022-03-31 ENCOUNTER — Other Ambulatory Visit: Payer: Self-pay

## 2022-04-13 ENCOUNTER — Other Ambulatory Visit: Payer: Self-pay

## 2022-04-24 ENCOUNTER — Other Ambulatory Visit: Payer: Self-pay

## 2022-04-28 ENCOUNTER — Other Ambulatory Visit: Payer: Self-pay

## 2022-06-16 DIAGNOSIS — R634 Abnormal weight loss: Secondary | ICD-10-CM | POA: Insufficient documentation

## 2022-06-19 ENCOUNTER — Other Ambulatory Visit: Payer: Self-pay

## 2022-06-24 DIAGNOSIS — E119 Type 2 diabetes mellitus without complications: Secondary | ICD-10-CM | POA: Diagnosis not present

## 2022-07-07 ENCOUNTER — Other Ambulatory Visit: Payer: Self-pay

## 2022-07-21 ENCOUNTER — Encounter: Payer: Self-pay | Admitting: *Deleted

## 2022-08-25 ENCOUNTER — Other Ambulatory Visit: Payer: Self-pay

## 2022-10-05 ENCOUNTER — Other Ambulatory Visit: Payer: Self-pay

## 2022-11-05 ENCOUNTER — Ambulatory Visit: Payer: Commercial Managed Care - PPO | Admitting: Student in an Organized Health Care Education/Training Program

## 2022-11-25 ENCOUNTER — Other Ambulatory Visit: Payer: Self-pay

## 2022-11-26 ENCOUNTER — Other Ambulatory Visit: Payer: Self-pay

## 2022-11-26 ENCOUNTER — Other Ambulatory Visit: Payer: Self-pay | Admitting: *Deleted

## 2022-12-02 ENCOUNTER — Other Ambulatory Visit: Payer: Self-pay

## 2022-12-07 NOTE — Telephone Encounter (Signed)
Mailbox full

## 2022-12-09 ENCOUNTER — Other Ambulatory Visit: Payer: Self-pay

## 2022-12-09 MED ORDER — HYDROCHLOROTHIAZIDE 25 MG PO TABS
25.0000 mg | ORAL_TABLET | Freq: Every day | ORAL | 0 refills | Status: DC
  Filled 2022-12-09: qty 30, 30d supply, fill #0

## 2023-01-08 DIAGNOSIS — H4321 Crystalline deposits in vitreous body, right eye: Secondary | ICD-10-CM | POA: Diagnosis not present

## 2023-01-08 DIAGNOSIS — H2513 Age-related nuclear cataract, bilateral: Secondary | ICD-10-CM | POA: Diagnosis not present

## 2023-01-12 ENCOUNTER — Other Ambulatory Visit: Payer: Self-pay

## 2023-01-12 MED ORDER — BENAZEPRIL HCL 40 MG PO TABS
40.0000 mg | ORAL_TABLET | Freq: Every day | ORAL | 0 refills | Status: DC
  Filled 2023-01-12: qty 30, 30d supply, fill #0

## 2023-01-12 MED ORDER — ATORVASTATIN CALCIUM 20 MG PO TABS
20.0000 mg | ORAL_TABLET | Freq: Every day | ORAL | 0 refills | Status: DC
  Filled 2023-01-12: qty 30, 30d supply, fill #0

## 2023-01-12 MED ORDER — AMLODIPINE BESYLATE 2.5 MG PO TABS
2.5000 mg | ORAL_TABLET | Freq: Every day | ORAL | 0 refills | Status: DC
  Filled 2023-01-12: qty 30, 30d supply, fill #0

## 2023-01-12 MED ORDER — HYDROCHLOROTHIAZIDE 25 MG PO TABS
25.0000 mg | ORAL_TABLET | Freq: Every day | ORAL | 0 refills | Status: DC
  Filled 2023-01-12: qty 30, 30d supply, fill #0

## 2023-01-12 NOTE — Telephone Encounter (Signed)
last visit 10/07/21 with plan to f/u PRN  next visit: none/active recall  Requested Prescriptions   Signed Prescriptions Disp Refills   amLODipine (NORVASC) 2.5 MG tablet 30 tablet 0    Sig: Take 1 tablet by mouth once daily for high blood pressure    Authorizing Provider: Debbe Odea    Ordering User: Feliberto Harts L   atorvastatin (LIPITOR) 20 MG tablet 30 tablet 0    Sig: Take 1 tablet (20 mg total) by mouth daily. Need appointment prior to next refill or request from primary care provider.  PLEASE CALL OFFICE TO SCHEDULE APPOINTMENT PRIOR TO NEXT REFILL (first attempt)    Authorizing Provider: Debbe Odea    Ordering User: Katrinka Blazing, Annmarie Plemmons L   hydrochlorothiazide (HYDRODIURIL) 25 MG tablet 30 tablet 0    Sig: Take 1 tablet (25 mg total) by mouth daily.    Authorizing Provider: Debbe Odea    Ordering User: Katrinka Blazing, Patryce Depriest L   benazepril (LOTENSIN) 40 MG tablet 30 tablet 0    Sig: Take 1 tablet (40 mg total) by mouth daily. Need appointment prior to next refill or request from primary care provider.  PLEASE CALL OFFICE TO SCHEDULE APPOINTMENT PRIOR TO NEXT REFILL (first attempt)    Authorizing Provider: Debbe Odea    Ordering User: Guerry Minors

## 2023-02-16 ENCOUNTER — Other Ambulatory Visit: Payer: Self-pay

## 2023-02-16 ENCOUNTER — Other Ambulatory Visit (HOSPITAL_COMMUNITY): Payer: Self-pay

## 2023-02-18 ENCOUNTER — Other Ambulatory Visit: Payer: Self-pay

## 2023-02-22 ENCOUNTER — Other Ambulatory Visit: Payer: Self-pay

## 2023-02-26 ENCOUNTER — Other Ambulatory Visit: Payer: Self-pay

## 2023-03-22 ENCOUNTER — Other Ambulatory Visit: Payer: Self-pay

## 2023-03-22 ENCOUNTER — Ambulatory Visit: Payer: Commercial Managed Care - PPO | Admitting: Internal Medicine

## 2023-03-22 ENCOUNTER — Encounter: Payer: Self-pay | Admitting: Internal Medicine

## 2023-03-22 VITALS — BP 156/84 | Ht 70.5 in | Wt 194.6 lb

## 2023-03-22 DIAGNOSIS — E1169 Type 2 diabetes mellitus with other specified complication: Secondary | ICD-10-CM | POA: Insufficient documentation

## 2023-03-22 DIAGNOSIS — M1A0790 Idiopathic chronic gout, unspecified ankle and foot, without tophus (tophi): Secondary | ICD-10-CM | POA: Insufficient documentation

## 2023-03-22 DIAGNOSIS — R739 Hyperglycemia, unspecified: Secondary | ICD-10-CM | POA: Diagnosis not present

## 2023-03-22 DIAGNOSIS — I10 Essential (primary) hypertension: Secondary | ICD-10-CM | POA: Insufficient documentation

## 2023-03-22 DIAGNOSIS — E782 Mixed hyperlipidemia: Secondary | ICD-10-CM | POA: Insufficient documentation

## 2023-03-22 DIAGNOSIS — G894 Chronic pain syndrome: Secondary | ICD-10-CM | POA: Insufficient documentation

## 2023-03-22 DIAGNOSIS — E663 Overweight: Secondary | ICD-10-CM | POA: Insufficient documentation

## 2023-03-22 DIAGNOSIS — Z6827 Body mass index (BMI) 27.0-27.9, adult: Secondary | ICD-10-CM | POA: Diagnosis not present

## 2023-03-22 MED ORDER — BENAZEPRIL HCL 40 MG PO TABS
40.0000 mg | ORAL_TABLET | Freq: Every day | ORAL | 1 refills | Status: DC
  Filled 2023-03-22: qty 90, 90d supply, fill #0
  Filled 2023-06-18: qty 90, 90d supply, fill #1

## 2023-03-22 MED ORDER — ATORVASTATIN CALCIUM 20 MG PO TABS
20.0000 mg | ORAL_TABLET | Freq: Every day | ORAL | 1 refills | Status: DC
  Filled 2023-03-22: qty 90, 90d supply, fill #0
  Filled 2023-06-18: qty 90, 90d supply, fill #1

## 2023-03-22 MED ORDER — AMLODIPINE BESYLATE 2.5 MG PO TABS
2.5000 mg | ORAL_TABLET | Freq: Every day | ORAL | 1 refills | Status: DC
  Filled 2023-03-22: qty 90, 90d supply, fill #0
  Filled 2023-06-18: qty 90, 90d supply, fill #1

## 2023-03-22 NOTE — Patient Instructions (Signed)

## 2023-03-22 NOTE — Assessment & Plan Note (Signed)
Will check uric acid level today Encourage low purine diet Encouraged him to take indomethacin only as needed

## 2023-03-22 NOTE — Assessment & Plan Note (Signed)
Referral to pain clinic per his request
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
## 2023-03-22 NOTE — Assessment & Plan Note (Signed)
C-Met and lipid profile today Encouraged him to consume a low-fat diet

## 2023-03-22 NOTE — Assessment & Plan Note (Signed)
Elevated today but he has been out of his amlodipine and benazepril, refilled today Reinforced DASH diet and exercise for weight loss C-Met today

## 2023-03-22 NOTE — Assessment & Plan Note (Signed)
Encouraged diet and exercise for weight loss ?

## 2023-03-23 LAB — CBC
HCT: 45.5 % (ref 38.5–50.0)
Hemoglobin: 15.1 g/dL (ref 13.2–17.1)
MCH: 30.8 pg (ref 27.0–33.0)
MCHC: 33.2 g/dL (ref 32.0–36.0)
MCV: 92.9 fL (ref 80.0–100.0)
MPV: 10.1 fL (ref 7.5–12.5)
Platelets: 231 10*3/uL (ref 140–400)
RBC: 4.9 10*6/uL (ref 4.20–5.80)
RDW: 11.7 % (ref 11.0–15.0)
WBC: 7.7 10*3/uL (ref 3.8–10.8)

## 2023-03-23 LAB — COMPLETE METABOLIC PANEL WITH GFR
AG Ratio: 1.6 (calc) (ref 1.0–2.5)
ALT: 22 U/L (ref 9–46)
AST: 18 U/L (ref 10–35)
Albumin: 4.5 g/dL (ref 3.6–5.1)
Alkaline phosphatase (APISO): 143 U/L (ref 35–144)
BUN: 10 mg/dL (ref 7–25)
CO2: 32 mmol/L (ref 20–32)
Calcium: 9.9 mg/dL (ref 8.6–10.3)
Chloride: 100 mmol/L (ref 98–110)
Creat: 1.13 mg/dL (ref 0.70–1.35)
Globulin: 2.9 g/dL (ref 1.9–3.7)
Glucose, Bld: 271 mg/dL — ABNORMAL HIGH (ref 65–99)
Potassium: 5.5 mmol/L — ABNORMAL HIGH (ref 3.5–5.3)
Sodium: 139 mmol/L (ref 135–146)
Total Bilirubin: 0.5 mg/dL (ref 0.2–1.2)
Total Protein: 7.4 g/dL (ref 6.1–8.1)
eGFR: 73 mL/min/{1.73_m2} (ref >=60)

## 2023-03-23 LAB — HEMOGLOBIN A1C
Hgb A1c MFr Bld: 9.1 %{Hb} — ABNORMAL HIGH (ref <5.7)
Mean Plasma Glucose: 214 mg/dL
eAG (mmol/L): 11.9 mmol/L

## 2023-03-23 LAB — LIPID PANEL
Cholesterol: 189 mg/dL (ref <200)
HDL: 61 mg/dL (ref >=40)
LDL Cholesterol (Calc): 108 mg/dL — ABNORMAL HIGH
Non-HDL Cholesterol (Calc): 128 mg/dL (ref <130)
Total CHOL/HDL Ratio: 3.1 (calc) (ref <5.0)
Triglycerides: 100 mg/dL (ref <150)

## 2023-03-23 LAB — URIC ACID: Uric Acid, Serum: 4.6 mg/dL (ref 4.0–8.0)

## 2023-03-25 ENCOUNTER — Ambulatory Visit (INDEPENDENT_AMBULATORY_CARE_PROVIDER_SITE_OTHER): Payer: Commercial Managed Care - PPO | Admitting: Internal Medicine

## 2023-03-25 ENCOUNTER — Encounter: Payer: Self-pay | Admitting: Internal Medicine

## 2023-03-25 VITALS — BP 148/80 | Ht 70.5 in | Wt 194.6 lb

## 2023-03-25 DIAGNOSIS — E875 Hyperkalemia: Secondary | ICD-10-CM

## 2023-03-25 DIAGNOSIS — E785 Hyperlipidemia, unspecified: Secondary | ICD-10-CM

## 2023-03-25 DIAGNOSIS — I10 Essential (primary) hypertension: Secondary | ICD-10-CM | POA: Diagnosis not present

## 2023-03-25 DIAGNOSIS — E1165 Type 2 diabetes mellitus with hyperglycemia: Secondary | ICD-10-CM | POA: Diagnosis not present

## 2023-03-25 DIAGNOSIS — E1169 Type 2 diabetes mellitus with other specified complication: Secondary | ICD-10-CM | POA: Diagnosis not present

## 2023-03-25 DIAGNOSIS — E663 Overweight: Secondary | ICD-10-CM

## 2023-03-25 DIAGNOSIS — Z6827 Body mass index (BMI) 27.0-27.9, adult: Secondary | ICD-10-CM | POA: Diagnosis not present

## 2023-03-25 NOTE — Assessment & Plan Note (Signed)
Discussed diabetes and standards of medical care He declines referral to diabetes education and nutrition at this time Encouraged low-carb diet and exercise for weight loss We will start metformin 850 mg twice daily, he has some leftover from a previous prescription Encouraged routine eye exam Encouraged routine foot exam Flu shot UTD He declines Pneumovax or Prevnar at this time

## 2023-03-25 NOTE — Assessment & Plan Note (Signed)
Discussed LDL goal less than 70 He recently just got started back on atorvastatin so we will recheck lipid profile in 3 months Encouraged low-fat diet

## 2023-03-25 NOTE — Assessment & Plan Note (Signed)
Discussed goal of 130/80 Continue benazepril and amlodipine for now If BP remains elevated at next visit we will consider changing medication to olmesartan-amlodipine and discontinue benazepril given his elevated potassium Avoid potassium containing foods at this time Reinforced DASH diet and exercise for weight loss

## 2023-03-25 NOTE — Patient Instructions (Signed)
Diabetes: Self-Care  Diabetes mellitus, or diabetes, is a long-term (chronic) disease. It happens when your body doesn't properly use the sugar, or glucose, that's released from food after you eat. Diabetes may happen if: Your pancreas doesn't make enough of a hormone called insulin. Your body doesn't react like it should to the insulin that it makes. Insulin lets sugar go into the cells in your body. This gives you energy. If you have diabetes, the sugar can't get into the cells. This causes high blood sugar. How to treat and manage diabetes You may need to take insulin or other medicines each day to keep your blood sugar in balance. If you need to take insulin, you'll learn how to give it to yourself as a shot. You may need to change the amount of insulin you take based on the foods you eat. You'll need to check your blood sugar levels with a glucose monitor. The readings will show if you have low or high blood sugar. In general, you should have these blood sugar levels: Before meals: 80-130 mg/dL (8.6-5.7 mmol/L). After meals: below 180 mg/dL (10 mmol/L). Hemoglobin A1c (HbA1c) level: less than 7%. Your health care provider will set treatment goals for you. Keep all follow-up visits. Your provider will watch you closely to make sure treatment is working. Follow these instructions at home: Diabetes medicines Take your medicines every day as told by your provider. List your medicines here: Name of medicine: ______________________________ Amount (dose): _______________ Time (a.m./p.m.): _______________ Notes: ___________________________________ Name of medicine: ______________________________ Amount (dose): _______________ Time (a.m./p.m.): _______________ Notes: ___________________________________ Name of medicine: ______________________________ Amount (dose): _______________ Time (a.m./p.m.): _______________ Notes: ___________________________________ Insulin If you use insulin, list the  types of insulin you use here: Insulin type: ______________________________ Amount (dose): _______________ Time (a.m./p.m.): _______________Notes: ___________________________________ Insulin type: ______________________________ Amount (dose): _______________ Time (a.m./p.m.): _______________ Notes: ___________________________________ Insulin type: ______________________________ Amount (dose): _______________ Time (a.m./p.m.): _______________ Notes: ___________________________________ Insulin type: ______________________________ Amount (dose): _______________ Time (a.m./p.m.): _______________ Notes: ___________________________________ Insulin type: ______________________________ Amount (dose): _______________ Time (a.m./p.m.): _______________ Notes: ___________________________________ Managing blood sugar  Check your blood sugar levels with a glucose monitor as told by your provider. Write down the times that you check your levels here: Time: _______________ Notes: ___________________________________ Time: _______________ Notes: ___________________________________ Time: _______________ Notes: ___________________________________ Time: _______________ Notes: ___________________________________ Time: _______________ Notes: ___________________________________ Time: _______________ Notes: ___________________________________  Low blood sugar Low blood sugar is when the amount of sugar in your blood is at or below 70 mg/dL (3.9 mmol/L). Symptoms may include: Feeling: Hungry. Sweaty and clammy. Irritable or easily upset. Dizzy. Sleepy. Having: A fast heartbeat. A headache. A change in your eyesight. Numbness around your mouth, lips, or tongue. Having trouble with: Moving your body, or coordination. Sleeping. Treating low blood sugar If you have low blood sugar, eat or drink something with sugar in it right away. If you can think clearly and swallow safely, follow the 15:15 rule: Take  15 gramsof a fast-acting carbohydrate (carb). Options include: 4 oz (120 mL) of fruit juice. 4 oz (120 mL) of soda (not diet soda). A few pieces of hard candy. Check food labels to see how many pieces to eat. 1 Tbsp (15 mL) of sugar or honey. 4 glucose tablets. 1 tube of glucose gel. Check your blood sugar 15 minutes after you take the carb. If your blood sugar is still at or below 70 mg/dL (3.9 mmol/L), take 15 grams of a carb again. If your blood sugar doesn't go above 70 mg/dL (3.9 mmol/L) after  3 tries, get help right away. After your blood sugar goes back to normal, eat a meal or a snack within 1 hour. Treating very low blood sugar If your blood sugar is less than 54 mg/dL (3 mmol/L), it's an emergency. Get help right away. If you can't eat or drink, you will need to be given glucagon. A family member or friend should learn how to check your blood sugar and give you glucagon. Ask your provider if you should keep a glucagon kit at home. You may also need to be treated in a hospital. Questions to ask your health care provider Should I talk with a diabetes educator? What tools will I need to care for myself at home? What medicines do I need? When should I take them? How often do I need to check my blood sugar levels? What number can I call if I have questions? When is my follow-up visit? Where can I find a support group for people with diabetes? Where to find more information American Diabetes Association (ADA): diabetes.org Association of Diabetes Care and Education Specialists: diabeteseducator.org Contact a health care provider if: Your blood sugar is at or above 240 mg/dL (16.1 mmol/L) for 2 days in a row. You've been sick or have had a fever for 2 days or more, and you're not getting better. For more than 6 hours: You can't eat or drink. You feel nauseous. You throw up. You have diarrhea. Get help right away if: Your blood sugar is below 54 mg/dL (3 mmol/L). You get  confused. You have trouble thinking clearly. You have trouble breathing. These symptoms may be an emergency. Get help right away. Call 911. Do not wait to see if the symptoms will go away. Do not drive yourself to the hospital. This information is not intended to replace advice given to you by your health care provider. Make sure you discuss any questions you have with your health care provider. Document Revised: 06/26/2022 Document Reviewed: 06/26/2022 Elsevier Patient Education  2024 [employer].

## 2023-03-25 NOTE — Progress Notes (Signed)
Subjective:    Patient ID: NATHYN LUIZ, male    DOB: 10/31/1960, 63 y.o.   MRN: [id number]  HPI  Patient presents the clinic today to discuss his recent labs.  His recent A1c was 9.1.  He reports he was prediabetic in the past but has never been diagnosed with diabetes.  He is not taking any oral diabetic medication at this time.  He does not check his sugars.  His last LDL was 108, triglycerides 100.  He denies myalgias on atorvastatin but he reports he had been out of it for a few weeks.  Of note, his potassium was 5.5.  He is not taking any potassium supplements that he is aware of.  Of note, his BP today is 152/82. He just recently restarted on his benazepril and amlodipine. ECG from 07/2021.  Review of Systems   Past Medical History:  Diagnosis Date   Hyperlipidemia    Hypertension     Current Outpatient Medications  Medication Sig Dispense Refill   acetaminophen (TYLENOL) 500 MG tablet Take 500 mg by mouth every 6 (six) hours as needed.     amLODipine (NORVASC) 2.5 MG tablet Take 1 tablet by mouth once daily for high blood pressure. 90 tablet 1   atorvastatin (LIPITOR) 20 MG tablet Take 1 tablet (20 mg total) by mouth daily. 90 tablet 1   benazepril (LOTENSIN) 40 MG tablet Take 1 tablet (40 mg total) by mouth daily. 90 tablet 1   indomethacin (INDOCIN) 25 MG capsule Take 1 capsule by mouth three times a day as needed for pain 90 capsule 2   No current facility-administered medications for this visit.    No Known Allergies  Family History  Problem Relation Age of Onset   ALS Mother    Heart disease Father 63       CABG /stents   Hyperlipidemia Father    Hypertension Father    Heart attack Father    Heart disease Brother        stent     Social History   Socioeconomic History   Marital status: Married    Spouse name: Not on file   Number of children: Not on file   Years of education: Not on file   Highest education level: High school graduate   Occupational History   Not on file  Tobacco Use   Smoking status: Every Day    Current packs/day: 1.00    Average packs/day: 1 pack/day for 9.1 years (9.1 ttl pk-yrs)    Types: Cigarettes    Start date: 2016   Smokeless tobacco: Never  Vaping Use   Vaping status: Never Used  Substance and Sexual Activity   Alcohol use: Yes    Comment: social    Drug use: Never   Sexual activity: Not on file  Other Topics Concern   Not on file  Social History Narrative   Not on file   Social Drivers of Health   Financial Resource Strain: Not on file  Food Insecurity: Not on file  Transportation Needs: Not on file  Physical Activity: Not on file  Stress: Not on file  Social Connections: Not on file  Intimate Partner Violence: Not on file     Constitutional: Denies fever, malaise, fatigue, headache or abrupt weight changes.  HEENT: Denies eye pain, eye redness, ear pain, ringing in the ears, wax buildup, runny nose, nasal congestion, bloody nose, or sore throat. Respiratory: Denies difficulty breathing, shortness of breath, cough or  sputum production.   Cardiovascular: Denies chest pain, chest tightness, palpitations or swelling in the hands or feet.  Gastrointestinal: Denies abdominal pain, bloating, constipation, diarrhea or blood in the stool.  GU: Denies urgency, frequency, pain with urination, burning sensation, blood in urine, odor or discharge. Musculoskeletal: Patient reports chronic joint pain.  Denies decrease in range of motion, difficulty with gait, muscle pain or joint swelling.  Skin: Denies redness, rashes, lesions or ulcercations.  Neurological: Denies dizziness, difficulty with memory, difficulty with speech or problems with balance and coordination.  Psych: Denies anxiety, depression, SI/HI.  No other specific complaints in a complete review of systems (except as listed in HPI above).      Objective:   Physical Exam BP (!) 148/80   Ht 5' 10.5" (1.791 m)   Wt 194 lb  9.6 oz (88.3 kg)   BMI 27.53 kg/m    Wt Readings from Last 3 Encounters:  03/22/23 194 lb 9.6 oz (88.3 kg)  10/07/21 199 lb 6.4 oz (90.4 kg)  07/31/21 201 lb (91.2 kg)    General: Appears his stated age, overweight, in NAD. Skin: Warm, dry and intact. No ulcerations noted. HEENT: Head: normal shape and size; Eyes: sclera white, no icterus, conjunctiva pink, PERRLA and EOMs intact;  Cardiovascular: Normal rate and rhythm. S1,S2 noted. Murmur noted. No JVD or BLE edema. No carotid bruits noted. Pulmonary/Chest: Normal effort and positive vesicular breath sounds. No respiratory distress. No wheezes, rales or ronchi noted.  Musculoskeletal: No difficulty with gait.  Neurological: Alert and oriented. Sensation intact to BLE. Coordination normal.       Assessment & Plan:    RTC in 3 months for follow-up of chronic conditions Nicki Reaper, NP

## 2023-03-25 NOTE — Assessment & Plan Note (Signed)
Encouraged diet and exercise for weight loss ?

## 2023-03-29 ENCOUNTER — Telehealth: Payer: Self-pay

## 2023-03-29 ENCOUNTER — Encounter: Payer: Self-pay | Admitting: *Deleted

## 2023-03-29 NOTE — Telephone Encounter (Signed)
Copied from CRM [id number]. Topic: Referral - Question >> Mar 29, 2023  3:22 PM Marlow Baars wrote: Reason for CRM: The patient called in stating he spoke to [facility] pain clinic and they told him they did not receive the referral that was faxed to them on 1/27 at 2:30. He is hoping it can be re faxed to [phone]. Please assist patient further

## 2023-03-30 NOTE — Telephone Encounter (Signed)
Can we refax this referral.

## 2023-04-23 ENCOUNTER — Other Ambulatory Visit: Payer: Self-pay

## 2023-04-23 ENCOUNTER — Other Ambulatory Visit: Payer: Self-pay | Admitting: Internal Medicine

## 2023-04-26 ENCOUNTER — Other Ambulatory Visit: Payer: Self-pay

## 2023-04-26 MED FILL — Indomethacin Cap 25 MG: ORAL | 30 days supply | Qty: 90 | Fill #0 | Status: AC

## 2023-04-26 NOTE — Telephone Encounter (Signed)
 Requested medication (s) are due for refill today- unsure  Requested medication (s) are on the active medication list -yes  Future visit scheduled -yes  Last refill: 10/07/21 #90 2RF  Notes to clinic: outside provider, expired Rx  Requested Prescriptions  Pending Prescriptions Disp Refills   indomethacin (INDOCIN) 25 MG capsule 90 capsule 2    Sig: Take 1 capsule by mouth three times a day as needed for pain     Analgesics:  NSAIDS Failed - 04/26/2023 12:30 PM      Failed - Manual Review: Labs are only required if the patient has taken medication for more than 8 weeks.      Passed - Cr in normal range and within 360 days    Creat  Date Value Ref Range Status  03/22/2023 1.13 0.70 - 1.35 mg/dL Final         Passed - HGB in normal range and within 360 days    Hemoglobin  Date Value Ref Range Status  03/22/2023 15.1 13.2 - 17.1 g/dL Final         Passed - PLT in normal range and within 360 days    Platelets  Date Value Ref Range Status  03/22/2023 231 140 - 400 Thousand/uL Final         Passed - HCT in normal range and within 360 days    HCT  Date Value Ref Range Status  03/22/2023 45.5 38.5 - 50.0 % Final         Passed - eGFR is 30 or above and within 360 days    eGFR  Date Value Ref Range Status  03/22/2023 73 > OR = 60 mL/min/1.26m2 Final         Passed - Patient is not pregnant      Passed - Valid encounter within last 12 months    Recent Outpatient Visits           1 month ago Type 2 diabetes mellitus with hyperglycemia, without long-term current use of insulin [facility])   [facility] [place], Salvadore Oxford, NP   1 month ago Primary hypertension   [facility] [place], Salvadore Oxford, NP       Future Appointments             In 1 month Baity, Salvadore Oxford, NP [facility], [facility]   In 5 months [place], Salvadore Oxford, NP [facility], [facility]               Requested  Prescriptions  Pending Prescriptions Disp Refills   indomethacin (INDOCIN) 25 MG capsule 90 capsule 2    Sig: Take 1 capsule by mouth three times a day as needed for pain     Analgesics:  NSAIDS Failed - 04/26/2023 12:30 PM      Failed - Manual Review: Labs are only required if the patient has taken medication for more than 8 weeks.      Passed - Cr in normal range and within 360 days    Creat  Date Value Ref Range Status  03/22/2023 1.13 0.70 - 1.35 mg/dL Final         Passed - HGB in normal range and within 360 days    Hemoglobin  Date Value Ref Range Status  03/22/2023 15.1 13.2 - 17.1 g/dL Final         Passed - PLT in normal range and within 360  days    Platelets  Date Value Ref Range Status  03/22/2023 231 140 - 400 Thousand/uL Final         Passed - HCT in normal range and within 360 days    HCT  Date Value Ref Range Status  03/22/2023 45.5 38.5 - 50.0 % Final         Passed - eGFR is 30 or above and within 360 days    eGFR  Date Value Ref Range Status  03/22/2023 73 > OR = 60 mL/min/1.46m2 Final         Passed - Patient is not pregnant      Passed - Valid encounter within last 12 months    Recent Outpatient Visits           1 month ago Type 2 diabetes mellitus with hyperglycemia, without long-term current use of insulin [facility])   [place] [facility] [place], Salvadore Oxford, NP   1 month ago Primary hypertension   [facility] [place], Salvadore Oxford, NP       Future Appointments             In 1 month [place], Salvadore Oxford, NP [facility], [facility]   In 5 months [place], Salvadore Oxford, NP [facility], [facility]

## 2023-05-17 DIAGNOSIS — G894 Chronic pain syndrome: Secondary | ICD-10-CM | POA: Diagnosis not present

## 2023-05-17 DIAGNOSIS — F1910 Other psychoactive substance abuse, uncomplicated: Secondary | ICD-10-CM | POA: Diagnosis not present

## 2023-05-17 DIAGNOSIS — Z79891 Long term (current) use of opiate analgesic: Secondary | ICD-10-CM | POA: Diagnosis not present

## 2023-05-17 DIAGNOSIS — M545 Low back pain, unspecified: Secondary | ICD-10-CM | POA: Diagnosis not present

## 2023-05-17 DIAGNOSIS — Z79899 Other long term (current) drug therapy: Secondary | ICD-10-CM | POA: Diagnosis not present

## 2023-05-31 DIAGNOSIS — Z79891 Long term (current) use of opiate analgesic: Secondary | ICD-10-CM | POA: Diagnosis not present

## 2023-05-31 DIAGNOSIS — M545 Low back pain, unspecified: Secondary | ICD-10-CM | POA: Diagnosis not present

## 2023-05-31 DIAGNOSIS — G894 Chronic pain syndrome: Secondary | ICD-10-CM | POA: Diagnosis not present

## 2023-05-31 DIAGNOSIS — F1910 Other psychoactive substance abuse, uncomplicated: Secondary | ICD-10-CM | POA: Diagnosis not present

## 2023-06-18 ENCOUNTER — Other Ambulatory Visit: Payer: Self-pay

## 2023-06-24 ENCOUNTER — Ambulatory Visit: Payer: Self-pay | Admitting: Internal Medicine
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
## 2023-06-28 DIAGNOSIS — G894 Chronic pain syndrome: Secondary | ICD-10-CM | POA: Diagnosis not present

## 2023-06-28 DIAGNOSIS — F1910 Other psychoactive substance abuse, uncomplicated: Secondary | ICD-10-CM | POA: Diagnosis not present

## 2023-06-28 DIAGNOSIS — M545 Low back pain, unspecified: Secondary | ICD-10-CM | POA: Diagnosis not present

## 2023-06-28 DIAGNOSIS — Z79891 Long term (current) use of opiate analgesic: Secondary | ICD-10-CM | POA: Diagnosis not present

## 2023-06-28 DIAGNOSIS — M2550 Pain in unspecified joint: Secondary | ICD-10-CM | POA: Diagnosis not present

## 2023-08-02 DIAGNOSIS — G894 Chronic pain syndrome: Secondary | ICD-10-CM | POA: Diagnosis not present

## 2023-08-02 DIAGNOSIS — F1910 Other psychoactive substance abuse, uncomplicated: Secondary | ICD-10-CM | POA: Diagnosis not present

## 2023-08-02 DIAGNOSIS — M545 Low back pain, unspecified: Secondary | ICD-10-CM | POA: Diagnosis not present

## 2023-08-02 DIAGNOSIS — Z79891 Long term (current) use of opiate analgesic: Secondary | ICD-10-CM | POA: Diagnosis not present

## 2023-08-02 DIAGNOSIS — M2550 Pain in unspecified joint: Secondary | ICD-10-CM | POA: Diagnosis not present

## 2023-08-02 DIAGNOSIS — F1120 Opioid dependence, uncomplicated: Secondary | ICD-10-CM | POA: Diagnosis not present

## 2023-08-30 DIAGNOSIS — Z79891 Long term (current) use of opiate analgesic: Secondary | ICD-10-CM | POA: Diagnosis not present

## 2023-08-30 DIAGNOSIS — F1120 Opioid dependence, uncomplicated: Secondary | ICD-10-CM | POA: Diagnosis not present

## 2023-08-30 DIAGNOSIS — G894 Chronic pain syndrome: Secondary | ICD-10-CM | POA: Diagnosis not present

## 2023-08-30 DIAGNOSIS — M2550 Pain in unspecified joint: Secondary | ICD-10-CM | POA: Diagnosis not present

## 2023-08-30 DIAGNOSIS — M545 Low back pain, unspecified: Secondary | ICD-10-CM | POA: Diagnosis not present

## 2023-09-14 ENCOUNTER — Other Ambulatory Visit: Payer: Self-pay

## 2023-09-14 MED FILL — Indomethacin Cap 25 MG: ORAL | 30 days supply | Qty: 90 | Fill #1 | Status: CN

## 2023-09-14 MED FILL — Indomethacin Cap 25 MG: ORAL | 30 days supply | Qty: 90 | Fill #1 | Status: AC

## 2023-09-17 ENCOUNTER — Other Ambulatory Visit: Payer: Self-pay

## 2023-09-17 ENCOUNTER — Other Ambulatory Visit: Payer: Self-pay | Admitting: Internal Medicine

## 2023-09-20 ENCOUNTER — Other Ambulatory Visit: Payer: Self-pay

## 2023-09-20 MED FILL — Benazepril HCl Tab 40 MG: ORAL | 90 days supply | Qty: 90 | Fill #0 | Status: AC

## 2023-09-20 MED FILL — Atorvastatin Calcium Tab 20 MG (Base Equivalent): ORAL | 90 days supply | Qty: 90 | Fill #0 | Status: AC

## 2023-09-20 MED FILL — Amlodipine Besylate Tab 2.5 MG (Base Equivalent): ORAL | 90 days supply | Qty: 90 | Fill #0 | Status: AC

## 2023-09-20 NOTE — Telephone Encounter (Signed)
 Appt 09/23/23 Requested Prescriptions  Pending Prescriptions Disp Refills   amLODipine  (NORVASC ) 2.5 MG tablet 90 tablet 1    Sig: Take 1 tablet by mouth once daily for high blood pressure.     Cardiovascular: Calcium  Channel Blockers 2 Failed - 09/20/2023 11:22 AM      Failed - Last BP in normal range    BP Readings from Last 1 Encounters:  03/25/23 (!) 148/80         Failed - Valid encounter within last 6 months    Recent Outpatient Visits   None     Future Appointments             In 3 days Baity, Angeline ORN, NP [facility], [facility]            Passed - Last Heart Rate in normal range    Pulse Readings from Last 1 Encounters:  10/07/21 84          atorvastatin  (LIPITOR) 20 MG tablet 90 tablet 1    Sig: Take 1 tablet (20 mg total) by mouth daily.     Cardiovascular:  Antilipid - Statins Failed - 09/20/2023 11:22 AM      Failed - Valid encounter within last 12 months    Recent Outpatient Visits   None     Future Appointments             In 3 days Baity, Angeline ORN, NP [place] [facility], [facility]            Failed - Lipid Panel in normal range within the last 12 months    Cholesterol  Date Value Ref Range Status  03/22/2023 189 <200 mg/dL Final   LDL Cholesterol (Calc)  Date Value Ref Range Status  03/22/2023 108 (H) mg/dL (calc) Final    Comment:    Reference range: <100 . Desirable range <100 mg/dL for primary prevention;   <70 mg/dL for patients with CHD or diabetic patients  with > or = 2 CHD risk factors. SABRA LDL-C is now calculated using the Martin-Hopkins  calculation, which is a validated novel method providing  better accuracy than the Friedewald equation in the  estimation of LDL-C.  Gladis APPLETHWAITE et al. SANDREA. 7986;689(80): 2061-2068  (http://education.[employer].com/faq/FAQ164)    HDL  Date Value Ref Range Status  03/22/2023 61 > OR = 40 mg/dL Final   Triglycerides  Date Value Ref Range Status   03/22/2023 100 <150 mg/dL Final         Passed - Patient is not pregnant       benazepril  (LOTENSIN ) 40 MG tablet 90 tablet 1    Sig: Take 1 tablet (40 mg total) by mouth daily.     Cardiovascular:  ACE Inhibitors Failed - 09/20/2023 11:22 AM      Failed - Cr in normal range and within 180 days    Creat  Date Value Ref Range Status  03/22/2023 1.13 0.70 - 1.35 mg/dL Final         Failed - K in normal range and within 180 days    Potassium  Date Value Ref Range Status  03/22/2023 5.5 (H) 3.5 - 5.3 mmol/L Final         Failed - Last BP in normal range    BP Readings from Last 1 Encounters:  03/25/23 (!) 148/80         Failed - Valid encounter within last 6 months  Recent Outpatient Visits   None     Future Appointments             In 3 days Baity, Angeline ORN, NP [facility], [facility]            Passed - Patient is not pregnant

## 2023-09-23 ENCOUNTER — Ambulatory Visit (INDEPENDENT_AMBULATORY_CARE_PROVIDER_SITE_OTHER): Payer: Self-pay | Admitting: Internal Medicine

## 2023-09-23 ENCOUNTER — Other Ambulatory Visit: Payer: Self-pay

## 2023-09-23 ENCOUNTER — Encounter: Payer: Self-pay | Admitting: Internal Medicine

## 2023-09-23 VITALS — BP 142/82 | Ht 70.5 in | Wt 197.1 lb

## 2023-09-23 DIAGNOSIS — E663 Overweight: Secondary | ICD-10-CM

## 2023-09-23 DIAGNOSIS — Z1211 Encounter for screening for malignant neoplasm of colon: Secondary | ICD-10-CM

## 2023-09-23 DIAGNOSIS — Z114 Encounter for screening for human immunodeficiency virus [HIV]: Secondary | ICD-10-CM | POA: Diagnosis not present

## 2023-09-23 DIAGNOSIS — M1A0790 Idiopathic chronic gout, unspecified ankle and foot, without tophus (tophi): Secondary | ICD-10-CM | POA: Diagnosis not present

## 2023-09-23 DIAGNOSIS — Z23 Encounter for immunization: Secondary | ICD-10-CM

## 2023-09-23 DIAGNOSIS — E785 Hyperlipidemia, unspecified: Secondary | ICD-10-CM | POA: Diagnosis not present

## 2023-09-23 DIAGNOSIS — Z1159 Encounter for screening for other viral diseases: Secondary | ICD-10-CM | POA: Diagnosis not present

## 2023-09-23 DIAGNOSIS — Z125 Encounter for screening for malignant neoplasm of prostate: Secondary | ICD-10-CM | POA: Diagnosis not present

## 2023-09-23 DIAGNOSIS — E1165 Type 2 diabetes mellitus with hyperglycemia: Secondary | ICD-10-CM | POA: Diagnosis not present

## 2023-09-23 DIAGNOSIS — Z7984 Long term (current) use of oral hypoglycemic drugs: Secondary | ICD-10-CM

## 2023-09-23 DIAGNOSIS — Z0001 Encounter for general adult medical examination with abnormal findings: Secondary | ICD-10-CM

## 2023-09-23 DIAGNOSIS — E1169 Type 2 diabetes mellitus with other specified complication: Secondary | ICD-10-CM | POA: Diagnosis not present

## 2023-09-23 DIAGNOSIS — I10 Essential (primary) hypertension: Secondary | ICD-10-CM

## 2023-09-23 MED ORDER — AMLODIPINE-OLMESARTAN 5-40 MG PO TABS
1.0000 | ORAL_TABLET | Freq: Every day | ORAL | 0 refills | Status: DC
  Filled 2023-09-23: qty 30, 30d supply, fill #0

## 2023-09-23 MED ORDER — AMLODIPINE BESYLATE 5 MG PO TABS
5.0000 mg | ORAL_TABLET | Freq: Every day | ORAL | 0 refills | Status: DC
Start: 2023-09-23 — End: 2023-12-27
  Filled 2023-09-23: qty 90, 90d supply, fill #0

## 2023-09-23 MED ORDER — OLMESARTAN MEDOXOMIL 40 MG PO TABS
40.0000 mg | ORAL_TABLET | Freq: Every day | ORAL | 0 refills | Status: DC
  Filled 2023-09-23: qty 90, 90d supply, fill #0

## 2023-09-23 NOTE — Patient Instructions (Signed)
 Health Maintenance, Male  Adopting a healthy lifestyle and getting preventive care are important in promoting health and wellness. Ask your health care provider about:  The right schedule for you to have regular tests and exams.  Things you can do on your own to prevent diseases and keep yourself healthy.  What should I know about diet, weight, and exercise?  Eat a healthy diet    Eat a diet that includes plenty of vegetables, fruits, low-fat dairy products, and lean protein.  Do not eat a lot of foods that are high in solid fats, added sugars, or sodium.  Maintain a healthy weight  Body mass index (BMI) is a measurement that can be used to identify possible weight problems. It estimates body fat based on height and weight. Your health care provider can help determine your BMI and help you achieve or maintain a healthy weight.  Get regular exercise  Get regular exercise. This is one of the most important things you can do for your health. Most adults should:  Exercise for at least 150 minutes each week. The exercise should increase your heart rate and make you sweat (moderate-intensity exercise).  Do strengthening exercises at least twice a week. This is in addition to the moderate-intensity exercise.  Spend less time sitting. Even light physical activity can be beneficial.  Watch cholesterol and blood lipids  Have your blood tested for lipids and cholesterol at 65 years of age, then have this test every 5 years.  You may need to have your cholesterol levels checked more often if:  Your lipid or cholesterol levels are high.  You are older than 65 years of age.  You are at high risk for heart disease.  What should I know about cancer screening?  Many types of cancers can be detected early and may often be prevented. Depending on your health history and family history, you may need to have cancer screening at various ages. This may include screening for:  Colorectal cancer.  Prostate cancer.  Skin cancer.  Lung  cancer.  What should I know about heart disease, diabetes, and high blood pressure?  Blood pressure and heart disease  High blood pressure causes heart disease and increases the risk of stroke. This is more likely to develop in people who have high blood pressure readings or are overweight.  Talk with your health care provider about your target blood pressure readings.  Have your blood pressure checked:  Every 3-5 years if you are 9-95 years of age.  Every year if you are 85 years old or older.  If you are between the ages of 29 and 29 and are a current or former smoker, ask your health care provider if you should have a one-time screening for abdominal aortic aneurysm (AAA).  Diabetes  Have regular diabetes screenings. This checks your fasting blood sugar level. Have the screening done:  Once every three years after age 23 if you are at a normal weight and have a low risk for diabetes.  More often and at a younger age if you are overweight or have a high risk for diabetes.  What should I know about preventing infection?  Hepatitis B  If you have a higher risk for hepatitis B, you should be screened for this virus. Talk with your health care provider to find out if you are at risk for hepatitis B infection.  Hepatitis C  Blood testing is recommended for:  Everyone born from [birth year] through 1967.  Anyone  with known risk factors for hepatitis C.  Sexually transmitted infections (STIs)  You should be screened each year for STIs, including gonorrhea and chlamydia, if:  You are sexually active and are younger than 65 years of age.  You are older than 65 years of age and your health care provider tells you that you are at risk for this type of infection.  Your sexual activity has changed since you were last screened, and you are at increased risk for chlamydia or gonorrhea. Ask your health care provider if you are at risk.  Ask your health care provider about whether you are at high risk for HIV. Your health care provider  may recommend a prescription medicine to help prevent HIV infection. If you choose to take medicine to prevent HIV, you should first get tested for HIV. You should then be tested every 3 months for as long as you are taking the medicine.  Follow these instructions at home:  Alcohol use  Do not drink alcohol if your health care provider tells you not to drink.  If you drink alcohol:  Limit how much you have to 0-2 drinks a day.  Know how much alcohol is in your drink. In the [place] one drink equals one 12 oz bottle of beer (355 mL), one 5 oz glass of wine (148 mL), or one 1 oz glass of hard liquor (44 mL).  Lifestyle  Do not use any products that contain nicotine or tobacco. These products include cigarettes, chewing tobacco, and vaping devices, such as e-cigarettes. If you need help quitting, ask your health care provider.  Do not use street drugs.  Do not share needles.  Ask your health care provider for help if you need support or information about quitting drugs.  General instructions  Schedule regular health, dental, and eye exams.  Stay current with your vaccines.  Tell your health care provider if:  You often feel depressed.  You have ever been abused or do not feel safe at home.  Summary  Adopting a healthy lifestyle and getting preventive care are important in promoting health and wellness.  Follow your health care provider's instructions about healthy diet, exercising, and getting tested or screened for diseases.  Follow your health care provider's instructions on monitoring your cholesterol and blood pressure.  This information is not intended to replace advice given to you by your health care provider. Make sure you discuss any questions you have with your health care provider.  Document Revised: 07/01/2020 Document Reviewed: 07/01/2020  Elsevier Patient Education  2024 [employer].

## 2023-09-23 NOTE — Progress Notes (Signed)
 Subjective:    Patient ID: Jack Richards, male    DOB: 1960/12/29, 63 y.o.   MRN: [id number]  HPI  Patient presents to today for his annual exam. Of note, his BP today is 148/78. He is taking benazepril  40 mg and amlodipine  2.5 mg as prescribed.  Of note, he missed his followup appt  in May for DM 2. His last A1C was 9.1%, LDL 108. He is taking metformin  850 mg BID and atorvastatin  20 mg daily.  Flu: 10/2022 Tetanus: > 10 years ago COVID: x 2 Prevnar: never Shingrix: never PSA screening: never Colon screening: > 10 years ago Vision screening: annually Dentist: as needed  Diet: He does eat meat. He consumes some fruits and veggies. He tries to avoid fried foods. He drinks mostly sweet tea. Exercise: Walking  Review of Systems   Past Medical History:  Diagnosis Date   Hyperlipidemia    Hypertension     Current Outpatient Medications  Medication Sig Dispense Refill   acetaminophen (TYLENOL) 500 MG tablet Take 500 mg by mouth every 6 (six) hours as needed.     amLODipine  (NORVASC ) 2.5 MG tablet Take 1 tablet by mouth once daily for high blood pressure. 90 tablet 0   atorvastatin  (LIPITOR) 20 MG tablet Take 1 tablet (20 mg total) by mouth daily. 90 tablet 0   benazepril  (LOTENSIN ) 40 MG tablet Take 1 tablet (40 mg total) by mouth daily. 90 tablet 0   indomethacin  (INDOCIN ) 25 MG capsule Take 1 capsule (25 mg total) by mouth 3 (three) times daily as needed for pain. 90 capsule 1   No current facility-administered medications for this visit.    No Known Allergies  Family History  Problem Relation Age of Onset   ALS Mother    Heart disease Father 74       CABG /stents   Hyperlipidemia Father    Hypertension Father    Heart attack Father    Heart disease Brother        stent     Social History   Socioeconomic History   Marital status: Married    Spouse name: Not on file   Number of children: Not on file   Years of education: Not on file   Highest education  level: High school graduate  Occupational History   Not on file  Tobacco Use   Smoking status: Every Day    Current packs/day: 1.00    Average packs/day: 1 pack/day for 9.6 years (9.6 ttl pk-yrs)    Types: Cigarettes    Start date: 2016   Smokeless tobacco: Never  Vaping Use   Vaping status: Never Used  Substance and Sexual Activity   Alcohol use: Yes    Comment: social    Drug use: Never   Sexual activity: Not on file  Other Topics Concern   Not on file  Social History Narrative   Not on file   Social Drivers of Health   Financial Resource Strain: Not on file  Food Insecurity: Not on file  Transportation Needs: Not on file  Physical Activity: Not on file  Stress: Not on file  Social Connections: Not on file  Intimate Partner Violence: Not on file     Constitutional: Denies fever, malaise, fatigue, headache or abrupt weight changes.  HEENT: Denies eye pain, eye redness, ear pain, ringing in the ears, wax buildup, runny nose, nasal congestion, bloody nose, or sore throat. Respiratory: Denies difficulty breathing, shortness of breath, cough or  sputum production.   Cardiovascular: Denies chest pain, chest tightness, palpitations or swelling in the hands or feet.  Gastrointestinal: Denies abdominal pain, bloating, constipation, diarrhea or blood in the stool.  GU: Denies urgency, frequency, pain with urination, burning sensation, blood in urine, odor or discharge. Musculoskeletal: Patient reports chronic joint and muscle pain.  Denies decrease in range of motion, difficulty with gait, or joint swelling.  Skin: Denies redness, rashes, lesions or ulcercations.  Neurological: Denies dizziness, difficulty with memory, difficulty with speech or problems with balance and coordination.  Psych: Denies anxiety, depression, SI/HI.  No other specific complaints in a complete review of systems (except as listed in HPI above).      Objective:   Physical Exam BP (!) 142/82   Ht 5'  10.5 (1.791 m)   Wt 197 lb 2 oz (89.4 kg)   BMI 27.88 kg/m   Wt Readings from Last 3 Encounters:  03/25/23 194 lb 9.6 oz (88.3 kg)  03/22/23 194 lb 9.6 oz (88.3 kg)  10/07/21 199 lb 6.4 oz (90.4 kg)    General: Appears his stated age, overweight, in NAD. Skin: Warm, dry and intact.  Sun damaged skin without ulcerations noted. HEENT: Head: normal shape and size; Eyes: sclera white, no icterus, conjunctiva pink, PERRLA and EOMs intact;  Neck:  Neck supple, trachea midline. No masses, lumps or thyromegaly present.  Cardiovascular: Normal rate and rhythm. S1,S2 noted.  Murmur noted. No JVD or BLE edema. No carotid bruits noted. Pulmonary/Chest: Normal effort and positive vesicular breath sounds. No respiratory distress. No wheezes, rales or ronchi noted.  Abdomen: Normal bowel sounds.  Musculoskeletal: Strength 5/5 BUE/BLE. No difficulty with gait.  Neurological: Alert and oriented. Cranial nerves II-XII grossly intact. Coordination normal.  Psychiatric: Mood and affect normal. Behavior is normal. Judgment and thought content normal.    BMET    Component Value Date/Time   NA 139 03/22/2023 1026   K 5.5 (H) 03/22/2023 1026   CL 100 03/22/2023 1026   CO2 32 03/22/2023 1026   GLUCOSE 271 (H) 03/22/2023 1026   BUN 10 03/22/2023 1026   CREATININE 1.13 03/22/2023 1026   CALCIUM  9.9 03/22/2023 1026    Lipid Panel     Component Value Date/Time   CHOL 189 03/22/2023 1026   TRIG 100 03/22/2023 1026   HDL 61 03/22/2023 1026   CHOLHDL 3.1 03/22/2023 1026   LDLCALC 108 (H) 03/22/2023 1026    CBC    Component Value Date/Time   WBC 7.7 03/22/2023 1026   RBC 4.90 03/22/2023 1026   HGB 15.1 03/22/2023 1026   HCT 45.5 03/22/2023 1026   PLT 231 03/22/2023 1026   MCV 92.9 03/22/2023 1026   MCH 30.8 03/22/2023 1026   MCHC 33.2 03/22/2023 1026   RDW 11.7 03/22/2023 1026    Hgb A1C Lab Results  Component Value Date   HGBA1C 9.1 (H) 03/22/2023            Assessment &  Plan:   Preventative health maintenance:  Encouraged him to get a flu shot in the fall Tetanus today Encouraged him to get his COVID-vaccine Prevnar 20 today Discussed Shingrix vaccine, he will check coverage with his insurance company and schedule visit if he would like to have this done Referral to GI for screening colonoscopy He declines lung cancer screening at this time Encouraged him to consume a balanced diet and exercise regimen Advised him to see an eye doctor and dentist annually We will check CBC, c-Met, lipid,  A1c, urine microalbumin, PSA, HIV and hep C today  RTC in 2 weeks, follow-up HTN, 3 months for follow-up of chronic conditions Angeline Laura, NP

## 2023-09-23 NOTE — Assessment & Plan Note (Signed)
 A1c and urine microalbumin today He declines referral to diabetes education and nutrition at this time Encouraged low-carb diet and exercise for weight loss Continue metformin  850 mg twice daily Encouraged routine eye exam Encouraged routine foot exam Flu shot UTD Prevnar 20 today

## 2023-09-23 NOTE — Assessment & Plan Note (Signed)
 Uncontrolled on benazepril  40 mg and amlodipine  2.5 mg daily Will increase amlodipine  to 5 mg daily Discontinue benazepril  40 mg Rx for olmesartan  40 mg daily Reinforced DASH diet and exercise for weight loss C-Met today

## 2023-09-23 NOTE — Assessment & Plan Note (Signed)
 Encouraged diet and exercise for weight loss ?

## 2023-09-23 NOTE — Addendum Note (Signed)
 Addended by: ANTONETTE ANGELINE ORN on: 09/23/2023 10:01 AM   Modules accepted: Level of Service

## 2023-09-23 NOTE — Assessment & Plan Note (Signed)
 Discussed LDL goal less than 70 C-Met and lipid profile today Continue atorvastatin  20 mg daily, will adjust if needed based on labs Encouraged low-fat diet

## 2023-09-24 ENCOUNTER — Other Ambulatory Visit: Payer: Self-pay

## 2023-09-24 ENCOUNTER — Ambulatory Visit: Payer: Self-pay | Admitting: Internal Medicine

## 2023-09-24 LAB — LIPID PANEL
Cholesterol: 124 mg/dL (ref <200)
HDL: 47 mg/dL (ref >=40)
LDL Cholesterol (Calc): 58 mg/dL
Non-HDL Cholesterol (Calc): 77 mg/dL (ref <130)
Total CHOL/HDL Ratio: 2.6 (calc) (ref <5.0)
Triglycerides: 107 mg/dL (ref <150)

## 2023-09-24 LAB — CBC
HCT: 40.3 % (ref 38.5–50.0)
Hemoglobin: 13.3 g/dL (ref 13.2–17.1)
MCH: 31 pg (ref 27.0–33.0)
MCHC: 33 g/dL (ref 32.0–36.0)
MCV: 93.9 fL (ref 80.0–100.0)
MPV: 9.6 fL (ref 7.5–12.5)
Platelets: 285 Thousand/uL (ref 140–400)
RBC: 4.29 Million/uL (ref 4.20–5.80)
RDW: 12.8 % (ref 11.0–15.0)
WBC: 7.1 Thousand/uL (ref 3.8–10.8)

## 2023-09-24 LAB — HEPATITIS C ANTIBODY: Hepatitis C Ab: NONREACTIVE

## 2023-09-24 LAB — COMPREHENSIVE METABOLIC PANEL WITH GFR
AG Ratio: 1.6 (calc) (ref 1.0–2.5)
ALT: 22 U/L (ref 9–46)
AST: 18 U/L (ref 10–35)
Albumin: 4.2 g/dL (ref 3.6–5.1)
Alkaline phosphatase (APISO): 118 U/L (ref 35–144)
BUN: 13 mg/dL (ref 7–25)
CO2: 30 mmol/L (ref 20–32)
Calcium: 9.4 mg/dL (ref 8.6–10.3)
Chloride: 100 mmol/L (ref 98–110)
Creat: 1.05 mg/dL (ref 0.70–1.35)
Globulin: 2.7 g/dL (ref 1.9–3.7)
Glucose, Bld: 244 mg/dL — ABNORMAL HIGH (ref 65–99)
Potassium: 4.3 mmol/L (ref 3.5–5.3)
Sodium: 140 mmol/L (ref 135–146)
Total Bilirubin: 0.5 mg/dL (ref 0.2–1.2)
Total Protein: 6.9 g/dL (ref 6.1–8.1)
eGFR: 79 mL/min/1.73m2 (ref >=60)

## 2023-09-24 LAB — MICROALBUMIN / CREATININE URINE RATIO
Creatinine, Urine: 193 mg/dL (ref 20–320)
Microalb Creat Ratio: 8 mg/g{creat} (ref <30)
Microalb, Ur: 1.5 mg/dL

## 2023-09-24 LAB — HEMOGLOBIN A1C
Hgb A1c MFr Bld: 7.9 % — ABNORMAL HIGH (ref <5.7)
Mean Plasma Glucose: 180 mg/dL
eAG (mmol/L): 10 mmol/L

## 2023-09-24 LAB — URIC ACID: Uric Acid, Serum: 5.1 mg/dL (ref 4.0–8.0)

## 2023-09-24 LAB — HIV ANTIBODY (ROUTINE TESTING W REFLEX): HIV 1&2 Ab, 4th Generation: NONREACTIVE

## 2023-09-24 LAB — PSA: PSA: 0.27 ng/mL (ref <=4.00)

## 2023-09-24 MED ORDER — GLIPIZIDE ER 5 MG PO TB24
5.0000 mg | ORAL_TABLET | Freq: Every day | ORAL | 0 refills | Status: DC
  Filled 2023-09-24: qty 90, 90d supply, fill #0

## 2023-09-27 ENCOUNTER — Telehealth: Payer: Self-pay

## 2023-09-27 DIAGNOSIS — F17210 Nicotine dependence, cigarettes, uncomplicated: Secondary | ICD-10-CM | POA: Diagnosis not present

## 2023-09-27 DIAGNOSIS — G894 Chronic pain syndrome: Secondary | ICD-10-CM | POA: Diagnosis not present

## 2023-09-27 DIAGNOSIS — M545 Low back pain, unspecified: Secondary | ICD-10-CM | POA: Diagnosis not present

## 2023-09-27 DIAGNOSIS — M2550 Pain in unspecified joint: Secondary | ICD-10-CM | POA: Diagnosis not present

## 2023-09-27 DIAGNOSIS — Z79891 Long term (current) use of opiate analgesic: Secondary | ICD-10-CM | POA: Diagnosis not present

## 2023-09-27 NOTE — Addendum Note (Signed)
 Addended by: ANTONETTE ANGELINE ORN on: 09/27/2023 02:23 PM   Modules accepted: Orders

## 2023-09-27 NOTE — Telephone Encounter (Signed)
 Copied from CRM [id number]. Topic: Referral - Request for Referral >> Sep 27, 2023  1:37 PM Avram MATSU wrote: Did the patient discuss referral with their provider in the last year? Yes (If No - schedule appointment) (If Yes - send message)  Appointment offered? Yes  Type of order/referral and detailed reason for visit: pain management   Preference of office, provider, location: [facility]   [address] [place] [address]  If referral order, have you been seen by this specialty before? Yes (If Yes, this issue or another issue? When? Where?  [facility]   Can we respond through MyChart? No

## 2023-09-27 NOTE — Telephone Encounter (Signed)
 Referral placed to [facility] per patient request

## 2023-09-30 ENCOUNTER — Telehealth: Payer: Self-pay

## 2023-09-30 ENCOUNTER — Other Ambulatory Visit: Payer: Self-pay

## 2023-09-30 DIAGNOSIS — Z1211 Encounter for screening for malignant neoplasm of colon: Secondary | ICD-10-CM

## 2023-09-30 DIAGNOSIS — F1120 Opioid dependence, uncomplicated: Secondary | ICD-10-CM | POA: Insufficient documentation

## 2023-09-30 NOTE — Telephone Encounter (Signed)
 Gastroenterology Pre-Procedure Review  Request Date: 01/10/24 Requesting Physician: Dr. Jinny  PATIENT REVIEW QUESTIONS: The patient responded to the following health history questions as indicated:    1. Are you having any GI issues? no 2. Do you have a personal history of Polyps? no 3. Do you have a family history of Colon Cancer or Polyps? no 4. Diabetes Mellitus? Prediabetic. Takes metformin  unsure about glipizide  but has been advised of stop dates for both and noted on instructions. 5. Joint replacements in the past 12 months?no 6. Major health problems in the past 3 months?no 7. Any artificial heart valves, MVP, or defibrillator?no    MEDICATIONS & ALLERGIES:    Patient reports the following regarding taking any anticoagulation/antiplatelet therapy:   Plavix, Coumadin, Eliquis, Xarelto, Lovenox, Pradaxa, Brilinta, or Effient? no Aspirin? no  Patient confirms/reports the following medications:  Current Outpatient Medications  Medication Sig Dispense Refill   amLODipine  (NORVASC ) 5 MG tablet Take 1 tablet (5 mg total) by mouth daily. 90 tablet 0   atorvastatin  (LIPITOR) 20 MG tablet Take 1 tablet (20 mg total) by mouth daily. 90 tablet 0   hydrochlorothiazide  (HYDRODIURIL ) 25 MG tablet Oral; Duration: 30 Days     indomethacin  (INDOCIN ) 25 MG capsule Take 1 capsule (25 mg total) by mouth 3 (three) times daily as needed for pain. 90 capsule 1   metFORMIN  (GLUCOPHAGE ) 850 MG tablet Take 850 mg by mouth 2 (two) times daily with a meal.     acetaminophen (TYLENOL) 500 MG tablet Take 500 mg by mouth every 6 (six) hours as needed. (Patient not taking: Reported on 09/30/2023)     glipiZIDE  (GLUCOTROL  XL) 5 MG 24 hr tablet Take 1 tablet (5 mg total) by mouth daily with breakfast. 90 tablet 0   olmesartan  (BENICAR ) 40 MG tablet Take 1 tablet (40 mg total) by mouth daily. 90 tablet 0   No current facility-administered medications for this visit.    Patient confirms/reports the following  allergies:  No Known Allergies  No orders of the defined types were placed in this encounter.   AUTHORIZATION INFORMATION Primary Insurance: 1D#: Group #:  Secondary Insurance: 1D#: Group #:  SCHEDULE INFORMATION: Date: 01/10/24 Time: Location: [facility]

## 2023-10-07 ENCOUNTER — Encounter: Payer: Self-pay | Admitting: Internal Medicine

## 2023-10-07 ENCOUNTER — Other Ambulatory Visit: Payer: Self-pay

## 2023-10-07 ENCOUNTER — Ambulatory Visit: Admitting: Internal Medicine

## 2023-10-07 VITALS — BP 138/74 | Ht 70.5 in | Wt 197.0 lb

## 2023-10-07 DIAGNOSIS — I10 Essential (primary) hypertension: Secondary | ICD-10-CM

## 2023-10-07 DIAGNOSIS — E1165 Type 2 diabetes mellitus with hyperglycemia: Secondary | ICD-10-CM | POA: Diagnosis not present

## 2023-10-07 DIAGNOSIS — Z6827 Body mass index (BMI) 27.0-27.9, adult: Secondary | ICD-10-CM

## 2023-10-07 DIAGNOSIS — E663 Overweight: Secondary | ICD-10-CM

## 2023-10-07 MED ORDER — METFORMIN HCL 1000 MG PO TABS
1000.0000 mg | ORAL_TABLET | Freq: Two times a day (BID) | ORAL | 0 refills | Status: DC
  Filled 2023-10-07: qty 180, 90d supply, fill #0

## 2023-10-07 NOTE — Patient Instructions (Signed)
 Hypertension, Adult Hypertension is another name for high blood pressure. High blood pressure forces your heart to work harder to pump blood. This can cause problems over time. There are two numbers in a blood pressure reading. There is a top number (systolic) over a bottom number (diastolic). It is best to have a blood pressure that is below 120/80. What are the causes? The cause of this condition is not known. Some other conditions can lead to high blood pressure. What increases the risk? Some lifestyle factors can make you more likely to develop high blood pressure: Smoking. Not getting enough exercise or physical activity. Being overweight. Having too much fat, sugar, calories, or salt (sodium) in your diet. Drinking too much alcohol . Other risk factors include: Having any of these conditions: Heart disease. Diabetes. High cholesterol. Kidney disease. Obstructive sleep apnea. Having a family history of high blood pressure and high cholesterol. Age. The risk increases with age. Stress. What are the signs or symptoms? High blood pressure may not cause symptoms. Very high blood pressure (hypertensive crisis) may cause: Headache. Fast or uneven heartbeats (palpitations). Shortness of breath. Nosebleed. Vomiting or feeling like you may vomit (nauseous). Changes in how you see. Very bad chest pain. Feeling dizzy. Seizures. How is this treated? This condition is treated by making healthy lifestyle changes, such as: Eating healthy foods. Exercising more. Drinking less alcohol . Your doctor may prescribe medicine if lifestyle changes do not help enough and if: Your top number is above 130. Your bottom number is above 80. Your personal target blood pressure may vary. Follow these instructions at home: Eating and drinking  If told, follow the DASH eating plan. To follow this plan: Fill one half of your plate at each meal with fruits and vegetables. Fill one fourth of your plate  at each meal with whole grains. Whole grains include whole-wheat pasta, brown rice, and whole-grain bread. Eat or drink low-fat dairy products, such as skim milk or low-fat yogurt. Fill one fourth of your plate at each meal with low-fat (lean) proteins. Low-fat proteins include fish, chicken without skin, eggs, beans, and tofu. Avoid fatty meat, cured and processed meat, or chicken with skin. Avoid pre-made or processed food. Limit the amount of salt in your diet to less than 1,500 mg each day. Do not drink alcohol  if: Your doctor tells you not to drink. You are pregnant, may be pregnant, or are planning to become pregnant. If you drink alcohol : Limit how much you have to: 0-1 drink a day for women. 0-2 drinks a day for men. Know how much alcohol  is in your drink. In the [place] one drink equals one 12 oz bottle of beer (355 mL), one 5 oz glass of wine (148 mL), or one 1 oz glass of hard liquor (44 mL). Lifestyle  Work with your doctor to stay at a healthy weight or to lose weight. Ask your doctor what the best weight is for you. Get at least 30 minutes of exercise that causes your heart to beat faster (aerobic exercise) most days of the week. This may include walking, swimming, or biking. Get at least 30 minutes of exercise that strengthens your muscles (resistance exercise) at least 3 days a week. This may include lifting weights or doing Pilates. Do not smoke or use any products that contain nicotine  or tobacco. If you need help quitting, ask your doctor. Check your blood pressure at home as told by your doctor. Keep all follow-up visits. Medicines Take over-the-counter and prescription medicines  only as told by your doctor. Follow directions carefully. Do not skip doses of blood pressure medicine. The medicine does not work as well if you skip doses. Skipping doses also puts you at risk for problems. Ask your doctor about side effects or reactions to medicines that you should watch  for. Contact a doctor if: You think you are having a reaction to the medicine you are taking. You have headaches that keep coming back. You feel dizzy. You have swelling in your ankles. You have trouble with your vision. Get help right away if: You get a very bad headache. You start to feel mixed up (confused). You feel weak or numb. You feel faint. You have very bad pain in your: Chest. Belly (abdomen). You vomit more than once. You have trouble breathing. These symptoms may be an emergency. Get help right away. Call 911. Do not wait to see if the symptoms will go away. Do not drive yourself to the hospital. Summary Hypertension is another name for high blood pressure. High blood pressure forces your heart to work harder to pump blood. For most people, a normal blood pressure is less than 120/80. Making healthy choices can help lower blood pressure. If your blood pressure does not get lower with healthy choices, you may need to take medicine. This information is not intended to replace advice given to you by your health care provider. Make sure you discuss any questions you have with your health care provider. Document Revised: 11/28/2020 Document Reviewed: 11/28/2020 Elsevier Patient Education  2024 [employer].

## 2023-10-07 NOTE — Assessment & Plan Note (Signed)
 Encouraged low-carb diet and exercise for weight loss We will change metformin  from 850 mg twice daily to 1000 mg twice daily Continue glipizide  5 mg XL daily.

## 2023-10-07 NOTE — Progress Notes (Signed)
 Subjective:    Patient ID: Jack Richards, male    DOB: Feb 11, 1961, 63 y.o.   MRN: [id number]  HPI  Patient presents to today for 2-week follow-up of HTN.  At his last visit, his benazepril  40 mg was discontinued and he was started on olmesartan  40 mg daily.  His amlodipine  was increased from 2.5 mg to 5 mg daily.  He continued on hydrochlorothiazide  25 mg daily.  He is unsure of which medications he is taking at this time.  His BP today is 140/78.SABRA  ECG from 07/2021 reviewed.  He also reports that his metformin  850 mg twice daily is too expensive at the pharmacy.  He is wondering if there is an alternative option.  Review of Systems   Past Medical History:  Diagnosis Date   Hyperlipidemia    Hypertension     Current Outpatient Medications  Medication Sig Dispense Refill   acetaminophen (TYLENOL) 500 MG tablet Take 500 mg by mouth every 6 (six) hours as needed. (Patient not taking: Reported on 09/30/2023)     amLODipine  (NORVASC ) 5 MG tablet Take 1 tablet (5 mg total) by mouth daily. 90 tablet 0   atorvastatin  (LIPITOR) 20 MG tablet Take 1 tablet (20 mg total) by mouth daily. 90 tablet 0   glipiZIDE  (GLUCOTROL  XL) 5 MG 24 hr tablet Take 1 tablet (5 mg total) by mouth daily with breakfast. 90 tablet 0   hydrochlorothiazide  (HYDRODIURIL ) 25 MG tablet Oral; Duration: 30 Days     indomethacin  (INDOCIN ) 25 MG capsule Take 1 capsule (25 mg total) by mouth 3 (three) times daily as needed for pain. 90 capsule 1   metFORMIN  (GLUCOPHAGE ) 850 MG tablet Take 850 mg by mouth 2 (two) times daily with a meal.     olmesartan  (BENICAR ) 40 MG tablet Take 1 tablet (40 mg total) by mouth daily. 90 tablet 0   No current facility-administered medications for this visit.    No Known Allergies  Family History  Problem Relation Age of Onset   ALS Mother    Heart disease Father 12       CABG /stents   Hyperlipidemia Father    Hypertension Father    Heart attack Father    Heart disease Brother         stent     Social History   Socioeconomic History   Marital status: Married    Spouse name: Not on file   Number of children: Not on file   Years of education: Not on file   Highest education level: High school graduate  Occupational History   Not on file  Tobacco Use   Smoking status: Every Day    Current packs/day: 1.00    Average packs/day: 1 pack/day for 9.6 years (9.6 ttl pk-yrs)    Types: Cigarettes    Start date: 2016   Smokeless tobacco: Never  Vaping Use   Vaping status: Never Used  Substance and Sexual Activity   Alcohol use: Yes    Comment: social    Drug use: Never   Sexual activity: Not on file  Other Topics Concern   Not on file  Social History Narrative   Not on file   Social Drivers of Health   Financial Resource Strain: Not on file  Food Insecurity: Not on file  Transportation Needs: Not on file  Physical Activity: Not on file  Stress: Not on file  Social Connections: Not on file  Intimate Partner Violence: Not on file  Constitutional: Denies fever, malaise, fatigue, headache or abrupt weight changes.  Respiratory: Denies difficulty breathing, shortness of breath, cough or sputum production.   Cardiovascular: Denies chest pain, chest tightness, palpitations or swelling in the hands or feet.  Musculoskeletal: Patient reports chronic joint and muscle pain.  Denies decrease in range of motion, difficulty with gait, or joint swelling.  Skin: Denies redness, rashes, lesions or ulcercations.  Neurological: Denies dizziness, difficulty with memory, difficulty with speech or problems with balance and coordination.    No other specific complaints in a complete review of systems (except as listed in HPI above).      Objective:   Physical Exam BP 138/74   Ht 5' 10.5 (1.791 m)   Wt 197 lb (89.4 kg)   BMI 27.87 kg/m    Wt Readings from Last 3 Encounters:  09/23/23 197 lb 2 oz (89.4 kg)  03/25/23 194 lb 9.6 oz (88.3 kg)  03/22/23 194 lb 9.6  oz (88.3 kg)    General: Appears his stated age, overweight, in NAD. HEENT: Head: normal shape and size; Eyes: sclera white, no icterus, conjunctiva pink, PERRLA and EOMs intact;  Cardiovascular: Normal rate and rhythm. S1,S2 noted.  Murmur noted. No JVD or BLE edema.  Pulmonary/Chest: Normal effort and positive vesicular breath sounds. No respiratory distress. No wheezes, rales or ronchi noted.  Neurological: Alert and oriented. Coordination normal.    BMET    Component Value Date/Time   NA 140 09/23/2023 0833   K 4.3 09/23/2023 0833   CL 100 09/23/2023 0833   CO2 30 09/23/2023 0833   GLUCOSE 244 (H) 09/23/2023 0833   BUN 13 09/23/2023 0833   CREATININE 1.05 09/23/2023 0833   CALCIUM  9.4 09/23/2023 0833    Lipid Panel     Component Value Date/Time   CHOL 124 09/23/2023 0833   TRIG 107 09/23/2023 0833   HDL 47 09/23/2023 0833   CHOLHDL 2.6 09/23/2023 0833   LDLCALC 58 09/23/2023 0833    CBC    Component Value Date/Time   WBC 7.1 09/23/2023 0833   RBC 4.29 09/23/2023 0833   HGB 13.3 09/23/2023 0833   HCT 40.3 09/23/2023 0833   PLT 285 09/23/2023 0833   MCV 93.9 09/23/2023 0833   MCH 31.0 09/23/2023 0833   MCHC 33.0 09/23/2023 0833   RDW 12.8 09/23/2023 0833    Hgb A1C Lab Results  Component Value Date   HGBA1C 7.9 (H) 09/23/2023            Assessment & Plan:     RTC in 2 months for follow-up of chronic conditions Angeline Laura, NP

## 2023-10-07 NOTE — Assessment & Plan Note (Signed)
 Encouraged diet and exercise for weight loss ?

## 2023-10-07 NOTE — Assessment & Plan Note (Signed)
 Initial BP elevated but manual repeat improved Advised him he should be taking olmesartan  40 mg daily, amlodipine  5 mg daily and HCTZ 25 mg daily Print out a copy of his medication list and advised him to go home and check this list against his medication Reinforced DASH diet and exercise for weight loss We will monitor

## 2023-10-08 ENCOUNTER — Other Ambulatory Visit: Payer: Self-pay

## 2023-10-26 DIAGNOSIS — M545 Low back pain, unspecified: Secondary | ICD-10-CM | POA: Diagnosis not present

## 2023-10-26 DIAGNOSIS — F1120 Opioid dependence, uncomplicated: Secondary | ICD-10-CM | POA: Diagnosis not present

## 2023-10-26 DIAGNOSIS — R202 Paresthesia of skin: Secondary | ICD-10-CM | POA: Diagnosis not present

## 2023-10-26 DIAGNOSIS — M25511 Pain in right shoulder: Secondary | ICD-10-CM | POA: Diagnosis not present

## 2023-10-26 DIAGNOSIS — G894 Chronic pain syndrome: Secondary | ICD-10-CM | POA: Diagnosis not present

## 2023-10-26 DIAGNOSIS — M2550 Pain in unspecified joint: Secondary | ICD-10-CM | POA: Diagnosis not present

## 2023-10-26 DIAGNOSIS — F17203 Nicotine dependence unspecified, with withdrawal: Secondary | ICD-10-CM | POA: Diagnosis not present

## 2023-10-26 DIAGNOSIS — Z79891 Long term (current) use of opiate analgesic: Secondary | ICD-10-CM | POA: Diagnosis not present

## 2023-11-01 DIAGNOSIS — G894 Chronic pain syndrome: Secondary | ICD-10-CM | POA: Diagnosis not present

## 2023-11-01 DIAGNOSIS — Z79891 Long term (current) use of opiate analgesic: Secondary | ICD-10-CM | POA: Diagnosis not present

## 2023-11-29 DIAGNOSIS — Z79891 Long term (current) use of opiate analgesic: Secondary | ICD-10-CM | POA: Diagnosis not present

## 2023-11-29 DIAGNOSIS — M79672 Pain in left foot: Secondary | ICD-10-CM | POA: Diagnosis not present

## 2023-11-29 DIAGNOSIS — G894 Chronic pain syndrome: Secondary | ICD-10-CM | POA: Diagnosis not present

## 2023-11-29 DIAGNOSIS — M545 Low back pain, unspecified: Secondary | ICD-10-CM | POA: Diagnosis not present

## 2023-11-29 DIAGNOSIS — E1142 Type 2 diabetes mellitus with diabetic polyneuropathy: Secondary | ICD-10-CM | POA: Diagnosis not present

## 2023-12-24 ENCOUNTER — Other Ambulatory Visit: Payer: Self-pay

## 2023-12-24 ENCOUNTER — Other Ambulatory Visit: Payer: Self-pay | Admitting: Internal Medicine

## 2023-12-27 ENCOUNTER — Ambulatory Visit: Admitting: Internal Medicine

## 2023-12-27 ENCOUNTER — Other Ambulatory Visit: Payer: Self-pay

## 2023-12-27 DIAGNOSIS — M79672 Pain in left foot: Secondary | ICD-10-CM | POA: Diagnosis not present

## 2023-12-27 DIAGNOSIS — R202 Paresthesia of skin: Secondary | ICD-10-CM | POA: Diagnosis not present

## 2023-12-27 DIAGNOSIS — G894 Chronic pain syndrome: Secondary | ICD-10-CM | POA: Diagnosis not present

## 2023-12-27 DIAGNOSIS — Z79891 Long term (current) use of opiate analgesic: Secondary | ICD-10-CM | POA: Diagnosis not present

## 2023-12-27 DIAGNOSIS — E1142 Type 2 diabetes mellitus with diabetic polyneuropathy: Secondary | ICD-10-CM | POA: Diagnosis not present

## 2023-12-27 MED FILL — Amlodipine Besylate Tab 5 MG (Base Equivalent): ORAL | 90 days supply | Qty: 90 | Fill #0 | Status: AC

## 2023-12-27 MED FILL — Olmesartan Medoxomil Tab 40 MG: ORAL | 90 days supply | Qty: 90 | Fill #0 | Status: AC

## 2023-12-27 MED FILL — Glipizide Tab ER 24HR 5 MG: ORAL | 90 days supply | Qty: 90 | Fill #0 | Status: AC

## 2023-12-27 NOTE — Telephone Encounter (Signed)
 Requested Prescriptions  Pending Prescriptions Disp Refills   amLODipine  (NORVASC ) 5 MG tablet 90 tablet 0    Sig: Take 1 tablet (5 mg total) by mouth daily.     Cardiovascular: Calcium  Channel Blockers 2 Passed - 12/27/2023 12:08 PM      Passed - Last BP in normal range    BP Readings from Last 1 Encounters:  10/07/23 138/74         Passed - Last Heart Rate in normal range    Pulse Readings from Last 1 Encounters:  10/07/21 84         Passed - Valid encounter within last 6 months    Recent Outpatient Visits           2 months ago Primary hypertension   [facility] [facility] [place], [place], NP   3 months ago Encounter for general adult medical examination with abnormal findings   [place] [facility] [place], Angeline ORN, NP               glipiZIDE  (GLUCOTROL  XL) 5 MG 24 hr tablet 90 tablet 0    Sig: Take 1 tablet (5 mg total) by mouth daily with breakfast.     Endocrinology:  Diabetes - Sulfonylureas Passed - 12/27/2023 12:08 PM      Passed - HBA1C is between 0 and 7.9 and within 180 days    Hgb A1c MFr Bld  Date Value Ref Range Status  09/23/2023 7.9 (H) <5.7 % Final    Comment:    For someone without known diabetes, a hemoglobin A1c value of 6.5% or greater indicates that they may have  diabetes and this should be confirmed with a follow-up  test. . For someone with known diabetes, a value <7% indicates  that their diabetes is well controlled and a value  greater than or equal to 7% indicates suboptimal  control. A1c targets should be individualized based on  duration of diabetes, age, comorbid conditions, and  other considerations. . Currently, no consensus exists regarding use of hemoglobin A1c for diagnosis of diabetes for children. .          Passed - Cr in normal range and within 360 days    Creat  Date Value Ref Range Status  09/23/2023 1.05 0.70 - 1.35 mg/dL Final   Creatinine, Urine  Date Value Ref Range  Status  09/23/2023 193 20 - 320 mg/dL Final         Passed - Valid encounter within last 6 months    Recent Outpatient Visits           2 months ago Primary hypertension   [place] [facility] [place], Angeline ORN, NP   3 months ago Encounter for general adult medical examination with abnormal findings   [facility] [place], Angeline ORN, NP               olmesartan  (BENICAR ) 40 MG tablet 90 tablet 0    Sig: Take 1 tablet (40 mg total) by mouth daily.     Cardiovascular:  Angiotensin Receptor Blockers Passed - 12/27/2023 12:08 PM      Passed - Cr in normal range and within 180 days    Creat  Date Value Ref Range Status  09/23/2023 1.05 0.70 - 1.35 mg/dL Final   Creatinine, Urine  Date Value Ref Range Status  09/23/2023 193 20 - 320 mg/dL Final  Passed - K in normal range and within 180 days    Potassium  Date Value Ref Range Status  09/23/2023 4.3 3.5 - 5.3 mmol/L Final         Passed - Patient is not pregnant      Passed - Last BP in normal range    BP Readings from Last 1 Encounters:  10/07/23 138/74         Passed - Valid encounter within last 6 months    Recent Outpatient Visits           2 months ago Primary hypertension   [place] [facility] [place], Angeline ORN, NP   3 months ago Encounter for general adult medical examination with abnormal findings    [facility] [place], Angeline ORN, NP

## 2023-12-30 ENCOUNTER — Ambulatory Visit: Admitting: Internal Medicine

## 2023-12-30 ENCOUNTER — Encounter: Payer: Self-pay | Admitting: Internal Medicine

## 2023-12-30 VITALS — BP 142/90 | Ht 70.5 in | Wt 206.8 lb

## 2023-12-30 DIAGNOSIS — E663 Overweight: Secondary | ICD-10-CM

## 2023-12-30 DIAGNOSIS — I10 Essential (primary) hypertension: Secondary | ICD-10-CM

## 2023-12-30 DIAGNOSIS — Z7984 Long term (current) use of oral hypoglycemic drugs: Secondary | ICD-10-CM | POA: Diagnosis not present

## 2023-12-30 DIAGNOSIS — G894 Chronic pain syndrome: Secondary | ICD-10-CM | POA: Diagnosis not present

## 2023-12-30 DIAGNOSIS — M1A0790 Idiopathic chronic gout, unspecified ankle and foot, without tophus (tophi): Secondary | ICD-10-CM | POA: Diagnosis not present

## 2023-12-30 DIAGNOSIS — E1165 Type 2 diabetes mellitus with hyperglycemia: Secondary | ICD-10-CM

## 2023-12-30 DIAGNOSIS — E785 Hyperlipidemia, unspecified: Secondary | ICD-10-CM

## 2023-12-30 DIAGNOSIS — F1120 Opioid dependence, uncomplicated: Secondary | ICD-10-CM

## 2023-12-30 DIAGNOSIS — Z6829 Body mass index (BMI) 29.0-29.9, adult: Secondary | ICD-10-CM

## 2023-12-30 DIAGNOSIS — E1169 Type 2 diabetes mellitus with other specified complication: Secondary | ICD-10-CM

## 2023-12-30 NOTE — Assessment & Plan Note (Signed)
 Discussed LDL goal less than 70 C-Met and lipid profile today Continue atorvastatin  20 mg daily, will adjust if needed based on labs Encouraged low-fat diet

## 2023-12-30 NOTE — Assessment & Plan Note (Signed)
 Currently on oxycodone 10 mg 4 times a day per pain management

## 2023-12-30 NOTE — Assessment & Plan Note (Signed)
 Continue oxycodone per pain management

## 2023-12-30 NOTE — Assessment & Plan Note (Signed)
 Encouraged diet and exercise for weight loss ?
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
## 2023-12-30 NOTE — Patient Instructions (Signed)
 Hypertension, Adult Hypertension is another name for high blood pressure. High blood pressure forces your heart to work harder to pump blood. This can cause problems over time. There are two numbers in a blood pressure reading. There is a top number (systolic) over a bottom number (diastolic). It is best to have a blood pressure that is below 120/80. What are the causes? The cause of this condition is not known. Some other conditions can lead to high blood pressure. What increases the risk? Some lifestyle factors can make you more likely to develop high blood pressure: Smoking. Not getting enough exercise or physical activity. Being overweight. Having too much fat, sugar, calories, or salt (sodium) in your diet. Drinking too much alcohol. Other risk factors include: Having any of these conditions: Heart disease. Diabetes. High cholesterol. Kidney disease. Obstructive sleep apnea. Having a family history of high blood pressure and high cholesterol. Age. The risk increases with age. Stress. What are the signs or symptoms? High blood pressure may not cause symptoms. Very high blood pressure (hypertensive crisis) may cause: Headache. Fast or uneven heartbeats (palpitations). Shortness of breath. Nosebleed. Vomiting or feeling like you may vomit (nauseous). Changes in how you see. Very bad chest pain. Feeling dizzy. Seizures. How is this treated? This condition is treated by making healthy lifestyle changes, such as: Eating healthy foods. Exercising more. Drinking less alcohol. Your doctor may prescribe medicine if lifestyle changes do not help enough and if: Your top number is above 130. Your bottom number is above 80. Your personal target blood pressure may vary. Follow these instructions at home: Eating and drinking  If told, follow the DASH eating plan. To follow this plan: Fill one half of your plate at each meal with fruits and vegetables. Fill one fourth of your plate  at each meal with whole grains. Whole grains include whole-wheat pasta, brown rice, and whole-grain bread. Eat or drink low-fat dairy products, such as skim milk or low-fat yogurt. Fill one fourth of your plate at each meal with low-fat (lean) proteins. Low-fat proteins include fish, chicken without skin, eggs, beans, and tofu. Avoid fatty meat, cured and processed meat, or chicken with skin. Avoid pre-made or processed food. Limit the amount of salt in your diet to less than 1,500 mg each day. Do not drink alcohol if: Your doctor tells you not to drink. You are pregnant, may be pregnant, or are planning to become pregnant. If you drink alcohol: Limit how much you have to: 0-1 drink a day for women. 0-2 drinks a day for men. Know how much alcohol is in your drink. In the [place] one drink equals one 12 oz bottle of beer (355 mL), one 5 oz glass of wine (148 mL), or one 1 oz glass of hard liquor (44 mL). Lifestyle  Work with your doctor to stay at a healthy weight or to lose weight. Ask your doctor what the best weight is for you. Get at least 30 minutes of exercise that causes your heart to beat faster (aerobic exercise) most days of the week. This may include walking, swimming, or biking. Get at least 30 minutes of exercise that strengthens your muscles (resistance exercise) at least 3 days a week. This may include lifting weights or doing Pilates. Do not smoke or use any products that contain nicotine or tobacco. If you need help quitting, ask your doctor. Check your blood pressure at home as told by your doctor. Keep all follow-up visits. Medicines Take over-the-counter and prescription medicines  only as told by your doctor. Follow directions carefully. Do not skip doses of blood pressure medicine. The medicine does not work as well if you skip doses. Skipping doses also puts you at risk for problems. Ask your doctor about side effects or reactions to medicines that you should watch  for. Contact a doctor if: You think you are having a reaction to the medicine you are taking. You have headaches that keep coming back. You feel dizzy. You have swelling in your ankles. You have trouble with your vision. Get help right away if: You get a very bad headache. You start to feel mixed up (confused). You feel weak or numb. You feel faint. You have very bad pain in your: Chest. Belly (abdomen). You vomit more than once. You have trouble breathing. These symptoms may be an emergency. Get help right away. Call 911. Do not wait to see if the symptoms will go away. Do not drive yourself to the hospital. Summary Hypertension is another name for high blood pressure. High blood pressure forces your heart to work harder to pump blood. For most people, a normal blood pressure is less than 120/80. Making healthy choices can help lower blood pressure. If your blood pressure does not get lower with healthy choices, you may need to take medicine. This information is not intended to replace advice given to you by your health care provider. Make sure you discuss any questions you have with your health care provider. Document Revised: 11/28/2020 Document Reviewed: 11/28/2020 Elsevier Patient Education  2024 [employer].

## 2023-12-30 NOTE — Assessment & Plan Note (Signed)
 Uric acid level reviewed Encourage low purine diet Encouraged him to take colchicine  0.6 mg indomethacin  25 mg only as needed

## 2023-12-30 NOTE — Assessment & Plan Note (Signed)
 Uncontrolled on benazepril  40 mg and amlodipine  2.5 mg daily which he is not supposed to be taking Advised him to go to the pharmacy and pick up amlodipine  5 mg, olmesartan  40 mg and HCTZ 25 mg daily and take these medications as prescribed Reinforced DASH diet and exercise for weight loss C-Met today

## 2023-12-30 NOTE — Assessment & Plan Note (Signed)
 A1c today Urine microalbumin has been checked within the last year Encouraged low-carb diet and exercise for weight loss Continue metformin  1000 mg twice daily Advised him to pick up glipizide  5 mg XL daily and start taking this as previously prescribed Encouraged routine eye exam Encouraged routine foot exam Immunizations UTD.

## 2023-12-31 ENCOUNTER — Ambulatory Visit: Payer: Self-pay | Admitting: Internal Medicine

## 2023-12-31 ENCOUNTER — Other Ambulatory Visit: Payer: Self-pay

## 2023-12-31 LAB — LIPID PANEL
Cholesterol: 102 mg/dL (ref <200)
HDL: 55 mg/dL (ref >=40)
LDL Cholesterol (Calc): 31 mg/dL
Non-HDL Cholesterol (Calc): 47 mg/dL (ref <130)
Total CHOL/HDL Ratio: 1.9 (calc) (ref <5.0)
Triglycerides: 78 mg/dL (ref <150)

## 2023-12-31 LAB — COMPREHENSIVE METABOLIC PANEL WITH GFR
AG Ratio: 1.7 (calc) (ref 1.0–2.5)
ALT: 75 U/L — ABNORMAL HIGH (ref 9–46)
AST: 39 U/L — ABNORMAL HIGH (ref 10–35)
Albumin: 4.4 g/dL (ref 3.6–5.1)
Alkaline phosphatase (APISO): 102 U/L (ref 35–144)
BUN: 20 mg/dL (ref 7–25)
CO2: 30 mmol/L (ref 20–32)
Calcium: 9.4 mg/dL (ref 8.6–10.3)
Chloride: 100 mmol/L (ref 98–110)
Creat: 1.11 mg/dL (ref 0.70–1.35)
Globulin: 2.6 g/dL (ref 1.9–3.7)
Glucose, Bld: 202 mg/dL — ABNORMAL HIGH (ref 65–99)
Potassium: 4.1 mmol/L (ref 3.5–5.3)
Sodium: 138 mmol/L (ref 135–146)
Total Bilirubin: 1.1 mg/dL (ref 0.2–1.2)
Total Protein: 7 g/dL (ref 6.1–8.1)
eGFR: 74 mL/min/1.73m2 (ref >=60)

## 2023-12-31 LAB — HEMOGLOBIN A1C
Hgb A1c MFr Bld: 6.5 % — ABNORMAL HIGH (ref <5.7)
Mean Plasma Glucose: 140 mg/dL
eAG (mmol/L): 7.7 mmol/L

## 2024-01-04 ENCOUNTER — Telehealth: Payer: Self-pay

## 2024-01-04 NOTE — Telephone Encounter (Signed)
 Returned phone call to reschedule patients colonoscopy.  Colonoscopy was rescheduled to 04/03/24 at [facility] With Dr. Jinny.  Instructions updated.  Referral updated.  Thanks,  [place], CMA

## 2024-01-10 ENCOUNTER — Ambulatory Visit: Admitting: Internal Medicine

## 2024-01-24 DIAGNOSIS — E611 Iron deficiency: Secondary | ICD-10-CM | POA: Diagnosis not present

## 2024-01-24 DIAGNOSIS — D519 Vitamin B12 deficiency anemia, unspecified: Secondary | ICD-10-CM | POA: Diagnosis not present

## 2024-01-24 DIAGNOSIS — G603 Idiopathic progressive neuropathy: Secondary | ICD-10-CM | POA: Diagnosis not present

## 2024-01-24 DIAGNOSIS — G894 Chronic pain syndrome: Secondary | ICD-10-CM | POA: Diagnosis not present

## 2024-01-24 DIAGNOSIS — M792 Neuralgia and neuritis, unspecified: Secondary | ICD-10-CM | POA: Diagnosis not present

## 2024-01-24 DIAGNOSIS — E539 Vitamin B deficiency, unspecified: Secondary | ICD-10-CM | POA: Diagnosis not present

## 2024-01-24 DIAGNOSIS — E559 Vitamin D deficiency, unspecified: Secondary | ICD-10-CM | POA: Diagnosis not present

## 2024-01-24 DIAGNOSIS — Z79891 Long term (current) use of opiate analgesic: Secondary | ICD-10-CM | POA: Diagnosis not present

## 2024-01-24 DIAGNOSIS — G8929 Other chronic pain: Secondary | ICD-10-CM | POA: Diagnosis not present

## 2024-01-24 DIAGNOSIS — M79671 Pain in right foot: Secondary | ICD-10-CM | POA: Diagnosis not present

## 2024-01-24 DIAGNOSIS — R5383 Other fatigue: Secondary | ICD-10-CM | POA: Diagnosis not present

## 2024-01-24 DIAGNOSIS — E612 Magnesium deficiency: Secondary | ICD-10-CM | POA: Diagnosis not present

## 2024-01-24 DIAGNOSIS — M545 Low back pain, unspecified: Secondary | ICD-10-CM | POA: Diagnosis not present

## 2024-01-24 DIAGNOSIS — M79672 Pain in left foot: Secondary | ICD-10-CM | POA: Diagnosis not present

## 2024-02-25 ENCOUNTER — Other Ambulatory Visit: Payer: Self-pay | Admitting: Internal Medicine

## 2024-02-25 ENCOUNTER — Other Ambulatory Visit: Payer: Self-pay

## 2024-02-25 MED ORDER — METFORMIN HCL 1000 MG PO TABS
1000.0000 mg | ORAL_TABLET | Freq: Two times a day (BID) | ORAL | 1 refills | Status: AC
  Filled 2024-02-25 (×2): qty 180, 90d supply, fill #0

## 2024-02-25 NOTE — Telephone Encounter (Signed)
 Rx 02/25/24 #180 1RF- duplicate request Requested Prescriptions  Pending Prescriptions Disp Refills   metFORMIN  (GLUCOPHAGE ) 1000 MG tablet 180 tablet 1    Sig: Take 1 tablet (1,000 mg total) by mouth 2 (two) times daily with a meal.     Endocrinology:  Diabetes - Biguanides Failed - 02/25/2024  4:26 PM      Failed - B12 Level in normal range and within 720 days    No results found for: VITAMINB12       Failed - CBC within normal limits and completed in the last 12 months    WBC  Date Value Ref Range Status  09/23/2023 7.1 3.8 - 10.8 Thousand/uL Final   RBC  Date Value Ref Range Status  09/23/2023 4.29 4.20 - 5.80 Million/uL Final   Hemoglobin  Date Value Ref Range Status  09/23/2023 13.3 13.2 - 17.1 g/dL Final   HCT  Date Value Ref Range Status  09/23/2023 40.3 38.5 - 50.0 % Final   MCHC  Date Value Ref Range Status  09/23/2023 33.0 32.0 - 36.0 g/dL Final    Comment:    For adults, a slight decrease in the calculated MCHC value (in the range of 30 to 32 g/dL) is most likely not clinically significant; however, it should be interpreted with caution in correlation with other red cell parameters and the patient's clinical condition.    [facility]  Date Value Ref Range Status  09/23/2023 31.0 27.0 - 33.0 pg Final   MCV  Date Value Ref Range Status  09/23/2023 93.9 80.0 - 100.0 fL Final   No results found for: PLTCOUNTKUC, LABPLAT, POCPLA RDW  Date Value Ref Range Status  09/23/2023 12.8 11.0 - 15.0 % Final         Passed - Cr in normal range and within 360 days    Creat  Date Value Ref Range Status  12/30/2023 1.11 0.70 - 1.35 mg/dL Final   Creatinine, Urine  Date Value Ref Range Status  09/23/2023 193 20 - 320 mg/dL Final         Passed - HBA1C is between 0 and 7.9 and within 180 days    Hgb A1c MFr Bld  Date Value Ref Range Status  12/30/2023 6.5 (H) <5.7 % Final    Comment:    For someone without known diabetes, a hemoglobin A1c value of 6.5% or  greater indicates that they may have  diabetes and this should be confirmed with a follow-up  test. . For someone with known diabetes, a value <7% indicates  that their diabetes is well controlled and a value  greater than or equal to 7% indicates suboptimal  control. A1c targets should be individualized based on  duration of diabetes, age, comorbid conditions, and  other considerations. . Currently, no consensus exists regarding use of hemoglobin A1c for diagnosis of diabetes for children. .          Passed - eGFR in normal range and within 360 days    eGFR  Date Value Ref Range Status  12/30/2023 74 > OR = 60 mL/min/1.45m2 Final         Passed - Valid encounter within last 6 months    Recent Outpatient Visits           1 month ago Type 2 diabetes mellitus with hyperglycemia, without long-term current use of insulin [facility])    [facility] [place], Angeline ORN, NP   4 months ago Primary hypertension  [place] [facility] [place], Angeline ORN, NP   5 months ago Encounter for general adult medical examination with abnormal findings   [place] [facility] [place], Angeline ORN, [place]

## 2024-02-25 NOTE — Telephone Encounter (Signed)
 Requested Prescriptions  Pending Prescriptions Disp Refills   metFORMIN  (GLUCOPHAGE ) 1000 MG tablet 180 tablet 1    Sig: Take 1 tablet (1,000 mg total) by mouth 2 (two) times daily with a meal.     Endocrinology:  Diabetes - Biguanides Failed - 02/25/2024  2:39 PM      Failed - B12 Level in normal range and within 720 days    No results found for: VITAMINB12       Failed - CBC within normal limits and completed in the last 12 months    WBC  Date Value Ref Range Status  09/23/2023 7.1 3.8 - 10.8 Thousand/uL Final   RBC  Date Value Ref Range Status  09/23/2023 4.29 4.20 - 5.80 Million/uL Final   Hemoglobin  Date Value Ref Range Status  09/23/2023 13.3 13.2 - 17.1 g/dL Final   HCT  Date Value Ref Range Status  09/23/2023 40.3 38.5 - 50.0 % Final   MCHC  Date Value Ref Range Status  09/23/2023 33.0 32.0 - 36.0 g/dL Final    Comment:    For adults, a slight decrease in the calculated MCHC value (in the range of 30 to 32 g/dL) is most likely not clinically significant; however, it should be interpreted with caution in correlation with other red cell parameters and the patient's clinical condition.    [facility]  Date Value Ref Range Status  09/23/2023 31.0 27.0 - 33.0 pg Final   MCV  Date Value Ref Range Status  09/23/2023 93.9 80.0 - 100.0 fL Final   No results found for: PLTCOUNTKUC, LABPLAT, POCPLA RDW  Date Value Ref Range Status  09/23/2023 12.8 11.0 - 15.0 % Final         Passed - Cr in normal range and within 360 days    Creat  Date Value Ref Range Status  12/30/2023 1.11 0.70 - 1.35 mg/dL Final   Creatinine, Urine  Date Value Ref Range Status  09/23/2023 193 20 - 320 mg/dL Final         Passed - HBA1C is between 0 and 7.9 and within 180 days    Hgb A1c MFr Bld  Date Value Ref Range Status  12/30/2023 6.5 (H) <5.7 % Final    Comment:    For someone without known diabetes, a hemoglobin A1c value of 6.5% or greater indicates that they may have   diabetes and this should be confirmed with a follow-up  test. . For someone with known diabetes, a value <7% indicates  that their diabetes is well controlled and a value  greater than or equal to 7% indicates suboptimal  control. A1c targets should be individualized based on  duration of diabetes, age, comorbid conditions, and  other considerations. . Currently, no consensus exists regarding use of hemoglobin A1c for diagnosis of diabetes for children. .          Passed - eGFR in normal range and within 360 days    eGFR  Date Value Ref Range Status  12/30/2023 74 > OR = 60 mL/min/1.11m2 Final         Passed - Valid encounter within last 6 months    Recent Outpatient Visits           1 month ago Type 2 diabetes mellitus with hyperglycemia, without long-term current use of insulin [facility])   [place] [facility] [place], Angeline ORN, NP   4 months ago Primary hypertension   [place] [facility]  [place] Angeline ORN, NP   5 months ago Encounter for general adult medical examination with abnormal findings   [place] [facility] [place], Angeline ORN, [place]

## 2024-02-25 NOTE — Telephone Encounter (Signed)
 Copied from CRM [id number]. Topic: Clinical - Medication Refill >> Feb 25, 2024 11:04 AM Sophia H wrote: Medication: metFORMIN  (GLUCOPHAGE ) 1000 MG tablet   Has the patient contacted their pharmacy? Yes, need updated RX  This is the patient's preferred pharmacy:  [facility] [address] [place] [address] Phone: [phone] Fax: [phone]  Is this the correct pharmacy for this prescription? Yes If no, delete pharmacy and type the correct one.   Has the prescription been filled recently? Yes  Is the patient out of the medication? Yes, completely out.  Has the patient been seen for an appointment in the last year OR does the patient have an upcoming appointment? Yes, appt Jan 5 - needing enough to get to that visit.   Can we respond through MyChart? Yes  Agent: Please be advised that Rx refills may take up to 3 business days. We ask that you follow-up with your pharmacy.

## 2024-02-28 ENCOUNTER — Ambulatory Visit (INDEPENDENT_AMBULATORY_CARE_PROVIDER_SITE_OTHER): Admitting: Internal Medicine

## 2024-02-28 ENCOUNTER — Encounter: Payer: Self-pay | Admitting: Internal Medicine

## 2024-02-28 VITALS — BP 132/72 | Ht 70.5 in | Wt 205.4 lb

## 2024-02-28 DIAGNOSIS — G894 Chronic pain syndrome: Secondary | ICD-10-CM

## 2024-02-28 DIAGNOSIS — I10 Essential (primary) hypertension: Secondary | ICD-10-CM

## 2024-02-28 NOTE — Patient Instructions (Signed)
 How to Take Your Blood Pressure Blood pressure measures how strongly your blood is pressing against the walls of your arteries. Arteries are blood vessels that carry blood from your heart throughout your body. You can take your blood pressure at home with a machine. You may need to check your blood pressure at home: To check if you have high blood pressure (hypertension). To check your blood pressure over time. To make sure your blood pressure medicine is working. Supplies needed: Blood pressure machine, or monitor. A chair to sit in. This should be a chair where you can sit upright with your back supported. Do not sit on a soft couch or an armchair. Table or desk. Small notebook. Pencil or pen. How to prepare Avoid these things for 30 minutes before checking your blood pressure: Having drinks with caffeine in them, such as coffee or tea. Drinking alcohol. Eating. Smoking. Exercising. Do these things five minutes before checking your blood pressure: Go to the bathroom and pee (urinate). Sit in a chair. Be quiet. Do not talk. How to take your blood pressure Follow the instructions that came with your machine. If you have a digital blood pressure monitor, these may be the instructions: Sit up straight. Place your feet on the floor. Do not cross your ankles or legs. Rest your left arm at the level of your heart. You may rest it on a table, desk, or chair. Pull up your shirt sleeve. Wrap the blood pressure cuff around the upper part of your left arm. The cuff should be 1 inch (2.5 cm) above your elbow. It is best to wrap the cuff around bare skin. Fit the cuff snugly around your arm, but not too tightly. You should be able to place only one finger between the cuff and your arm. Place the cord so that it rests in the bend of your elbow. Press the power button. Sit quietly while the cuff fills with air and loses air. Write down the numbers on the screen. Wait 2-3 minutes and then repeat  steps 1-10. What do the numbers mean? Two numbers make up your blood pressure. The first number is called systolic pressure. The second is called diastolic pressure. An example of a blood pressure reading is "120 over 80" (or 120/80). If you are an adult and do not have a medical condition, use this guide to find out if your blood pressure is normal: Normal First number: below 120. Second number: below 80. Elevated First number: 120-129. Second number: below 80. Hypertension stage 1 First number: 130-139. Second number: 80-89. Hypertension stage 2 First number: 140 or above. Second number: 90 or above. Your blood pressure is above normal even if only the first or only the second number is above normal. Follow these instructions at home: Medicines Take over-the-counter and prescription medicines only as told by your doctor. Tell your doctor if your medicine is causing side effects. General instructions Check your blood pressure as often as your doctor tells you to. Check your blood pressure at the same time every day. Take your monitor to your next doctor's appointment. Your doctor will: Make sure you are using it correctly. Make sure it is working right. Understand what your blood pressure numbers should be. Keep all follow-up visits. General tips You will need a blood pressure machine or monitor. Your doctor can suggest a monitor. You can buy one at a drugstore or online. When choosing one: Choose one with an arm cuff. Choose one that wraps around your  upper arm. Only one finger should fit between your arm and the cuff. Do not choose one that measures your blood pressure from your wrist or finger. Where to find more information American Heart Association: www.heart.org Contact a doctor if: Your blood pressure keeps being high. Your blood pressure is suddenly low. Get help right away if: Your first blood pressure number is higher than 180. Your second blood pressure number is  higher than 120. These symptoms may be an emergency. Do not wait to see if the symptoms will go away. Get help right away. Call 911. Summary Check your blood pressure at the same time every day. Avoid caffeine, alcohol, smoking, and exercise for 30 minutes before checking your blood pressure. Make sure you understand what your blood pressure numbers should be. This information is not intended to replace advice given to you by your health care provider. Make sure you discuss any questions you have with your health care provider. Document Revised: 10/24/2020 Document Reviewed: 10/24/2020 Elsevier Patient Education  2024 [employer].

## 2024-02-28 NOTE — Progress Notes (Signed)
 "  Subjective:    Patient ID: Jack Richards, male    DOB: [birth year], 64 y.o.   MRN: [id number]  HPI  Discussed the use of AI scribe software for clinical note transcription with the patient, who gave verbal consent to proceed.  Jack Richards is a 64 year old male with hypertension who presents for medication management and follow-up.  He takes olmesartan  and amlodipine  daily. He has not been taking hydrochlorothiazide , prescribed at 25 mg, due to not having it filled. His blood pressure has improved, with recent readings around 132/72, compared to previous readings of 144/90 and 148/82.  He experiences significant swelling in his legs, which he attributes to neuropathy. He has a history of neuropathy in his legs, with one leg having been broken three times. No shortness of breath.  He monitors his blood pressure at home and notes it has been 'pretty good,' with occasional higher readings on the top number, but not over 130. He was surprised by a low reading at a recent pain doctor visit.     Review of Systems   Past Medical History:  Diagnosis Date   Hyperlipidemia    Hypertension     Current Outpatient Medications  Medication Sig Dispense Refill   acetaminophen (TYLENOL) 500 MG tablet Take 500 mg by mouth every 6 (six) hours as needed.     amLODipine  (NORVASC ) 5 MG tablet Take 1 tablet (5 mg total) by mouth daily. (Patient taking differently: Take 2.5 mg by mouth daily.) 90 tablet 0   atorvastatin  (LIPITOR) 20 MG tablet Take 1 tablet (20 mg total) by mouth daily. 90 tablet 0   colchicine  0.6 MG tablet Take 0.6 mg by mouth as needed. (Patient not taking: Reported on 12/30/2023)     gabapentin (NEURONTIN) 100 MG capsule Take 1 capsule by mouth 2 (two) times daily.     glipiZIDE  (GLUCOTROL  XL) 5 MG 24 hr tablet Take 1 tablet (5 mg total) by mouth daily with breakfast. (Patient not taking: Reported on 12/30/2023) 90 tablet 0   hydrochlorothiazide  (HYDRODIURIL ) 25 MG tablet Oral;  Duration: 30 Days (Patient not taking: Reported on 12/30/2023)     indomethacin  (INDOCIN ) 25 MG capsule Take 1 capsule (25 mg total) by mouth 3 (three) times daily as needed for pain. (Patient not taking: Reported on 12/30/2023) 90 capsule 1   metFORMIN  (GLUCOPHAGE ) 1000 MG tablet Take 1 tablet (1,000 mg total) by mouth 2 (two) times daily with a meal. 180 tablet 1   olmesartan  (BENICAR ) 40 MG tablet Take 1 tablet (40 mg total) by mouth daily. (Patient not taking: Reported on 12/30/2023) 90 tablet 0   Oxycodone HCl 10 MG TABS Take 10 mg by mouth 4 (four) times daily as needed.     No current facility-administered medications for this visit.    No Known Allergies  Family History  Problem Relation Age of Onset   ALS Mother    Heart disease Father 54       CABG /stents   Hyperlipidemia Father    Hypertension Father    Heart attack Father    Heart disease Brother        stent     Social History   Socioeconomic History   Marital status: Married    Spouse name: Not on file   Number of children: Not on file   Years of education: Not on file   Highest education level: High school graduate  Occupational History   Not on file  Tobacco Use   Smoking status: Every Day    Current packs/day: 1.00    Average packs/day: 1 pack/day for 10.0 years (10.0 ttl pk-yrs)    Types: Cigarettes    Start date: 2016   Smokeless tobacco: Never  Vaping Use   Vaping status: Never Used  Substance and Sexual Activity   Alcohol use: Yes    Comment: social    Drug use: Never   Sexual activity: Not on file  Other Topics Concern   Not on file  Social History Narrative   Not on file   Social Drivers of Health   Tobacco Use: High Risk (12/30/2023)   Patient History    Smoking Tobacco Use: Every Day    Smokeless Tobacco Use: Never    Passive Exposure: Not on file  Financial Resource Strain: Not on file  Food Insecurity: Not on file  Transportation Needs: Not on file  Physical Activity: Not on file   Stress: Not on file  Social Connections: Not on file  Intimate Partner Violence: Not on file  Depression (PHQ2-9): Low Risk (12/30/2023)   Depression (PHQ2-9)    PHQ-2 Score: 4  Alcohol Screen: Not on file  Housing: Not on file  Utilities: Not on file  Health Literacy: Not on file     Constitutional: Denies fever, malaise, fatigue, headache or abrupt weight changes.  HEENT: Denies eye pain, eye redness, ear pain, ringing in the ears, wax buildup, runny nose, nasal congestion, bloody nose, or sore throat. Respiratory: Denies difficulty breathing, shortness of breath, cough or sputum production.   Cardiovascular: Denies chest pain, chest tightness, palpitations or swelling in the hands or feet.  Gastrointestinal: Denies abdominal pain, bloating, constipation, diarrhea or blood in the stool.  GU: Denies urgency, frequency, pain with urination, burning sensation, blood in urine, odor or discharge. Musculoskeletal: Patient reports chronic joint pain.  Denies decrease in range of motion, difficulty with gait, muscle pain or joint swelling.  Skin: Denies redness, rashes, lesions or ulcercations.  Neurological: Patient reports neuropathic pain in legs.  Denies dizziness, difficulty with memory, difficulty with speech or problems with balance and coordination.  Psych: Denies anxiety, depression, SI/HI.  No other specific complaints in a complete review of systems (except as listed in HPI above).      Objective:   Physical Exam BP 132/72 (BP Location: Right Arm, Patient Position: Sitting, Cuff Size: Normal)   Ht 5' 10.5 (1.791 m)   Wt 205 lb 6.4 oz (93.2 kg)   BMI 29.06 kg/m     Wt Readings from Last 3 Encounters:  12/30/23 206 lb 12.8 oz (93.8 kg)  10/07/23 197 lb (89.4 kg)  09/23/23 197 lb 2 oz (89.4 kg)    General: Appears his stated age, overweight, in NAD. Skin: Warm, dry and intact.  No ulcerations noted. HEENT: Head: normal shape and size; Eyes: sclera white, no icterus,  conjunctiva pink, PERRLA and EOMs intact;  Cardiovascular: Normal rate and rhythm. S1,S2 noted. Murmur noted. No JVD.  Trace pitting BLE edema. No carotid bruits noted. Pulmonary/Chest: Normal effort and positive vesicular breath sounds. No respiratory distress. No wheezes, rales or ronchi noted.  Musculoskeletal: No signs of joint swelling. No difficulty with gait.  Neurological: Alert and oriented. Coordination normal.   CBC    Component Value Date/Time   WBC 7.1 09/23/2023 0833   RBC 4.29 09/23/2023 0833   HGB 13.3 09/23/2023 0833   HCT 40.3 09/23/2023 0833   PLT 285 09/23/2023 0833   MCV  93.9 09/23/2023 0833   MCH 31.0 09/23/2023 0833   MCHC 33.0 09/23/2023 0833   RDW 12.8 09/23/2023 0833      Latest Ref Rng & Units 12/30/2023    8:37 AM 09/23/2023    8:33 AM 03/22/2023   10:26 AM  CMP  Glucose 65 - 99 mg/dL 797  755  728   BUN 7 - 25 mg/dL 20  13  10    Creatinine 0.70 - 1.35 mg/dL 8.88  8.94  8.86   Sodium 135 - 146 mmol/L 138  140  139   Potassium 3.5 - 5.3 mmol/L 4.1  4.3  5.5   Chloride 98 - 110 mmol/L 100  100  100   CO2 20 - 32 mmol/L 30  30  32   Calcium  8.6 - 10.3 mg/dL 9.4  9.4  9.9   Total Protein 6.1 - 8.1 g/dL 7.0  6.9  7.4   Total Bilirubin 0.2 - 1.2 mg/dL 1.1  0.5  0.5   AST 10 - 35 U/L 39  18  18   ALT 9 - 46 U/L 75  22  22    Lipid Panel     Component Value Date/Time   CHOL 102 12/30/2023 0837   TRIG 78 12/30/2023 0837   HDL 55 12/30/2023 0837   CHOLHDL 1.9 12/30/2023 0837   LDLCALC 31 12/30/2023 0837   Lab Results  Component Value Date   HGBA1C 6.5 (H) 12/30/2023   HGBA1C 7.9 (H) 09/23/2023   HGBA1C 9.1 (H) 03/22/2023         Assessment & Plan:  Assessment and Plan    Hypertension Blood pressure improved to 132/72 mmHg. - Continue amlodipine  5 mg daily. - Continue olmesartan  40 mg daily. - Start hydrochlorothiazide  25 mg daily for leg swelling. -  Reinforced DASH diet and exercise for weight loss  Chronic pain syndrome He reports  he has an upcoming appointment at a different pain medicine specialist office -Continue gabapentin and oxycodone as previously prescribed by your current pain management specialist   RTC in 7 months for your annual exam Angeline Laura, NP  "

## 2024-02-29 ENCOUNTER — Other Ambulatory Visit: Payer: Self-pay | Admitting: Internal Medicine

## 2024-02-29 ENCOUNTER — Other Ambulatory Visit: Payer: Self-pay

## 2024-03-01 ENCOUNTER — Other Ambulatory Visit: Payer: Self-pay | Admitting: Cardiology

## 2024-03-01 ENCOUNTER — Other Ambulatory Visit: Payer: Self-pay

## 2024-03-02 ENCOUNTER — Other Ambulatory Visit: Payer: Self-pay

## 2024-03-02 ENCOUNTER — Other Ambulatory Visit (HOSPITAL_COMMUNITY): Payer: Self-pay

## 2024-03-02 MED ORDER — ATORVASTATIN CALCIUM 20 MG PO TABS
20.0000 mg | ORAL_TABLET | Freq: Every day | ORAL | 3 refills | Status: AC
  Filled 2024-03-02: qty 90, 90d supply, fill #0

## 2024-03-02 MED ORDER — HYDROCHLOROTHIAZIDE 25 MG PO TABS
25.0000 mg | ORAL_TABLET | Freq: Every day | ORAL | 1 refills | Status: AC
  Filled 2024-03-02 (×2): qty 90, 90d supply, fill #0

## 2024-03-02 NOTE — Telephone Encounter (Signed)
 Requested Prescriptions  Pending Prescriptions Disp Refills   atorvastatin  (LIPITOR) 20 MG tablet 90 tablet 3    Sig: Take 1 tablet (20 mg total) by mouth daily.     Cardiovascular:  Antilipid - Statins Failed - 03/02/2024 10:17 AM      Failed - Lipid Panel in normal range within the last 12 months    Cholesterol  Date Value Ref Range Status  12/30/2023 102 <200 mg/dL Final   LDL Cholesterol (Calc)  Date Value Ref Range Status  12/30/2023 31 mg/dL (calc) Final    Comment:    Reference range: <100 . Desirable range <100 mg/dL for primary prevention;   <70 mg/dL for patients with CHD or diabetic patients  with > or = 2 CHD risk factors. SABRA LDL-C is now calculated using the Martin-Hopkins  calculation, which is a validated novel method providing  better accuracy than the Friedewald equation in the  estimation of LDL-C.  Gladis APPLETHWAITE et al. SANDREA. 7986;689(80): 2061-2068  (http://education.[employer].com/faq/FAQ164)    HDL  Date Value Ref Range Status  12/30/2023 55 > OR = 40 mg/dL Final   Triglycerides  Date Value Ref Range Status  12/30/2023 78 <150 mg/dL Final         Passed - Patient is not pregnant      Passed - Valid encounter within last 12 months    Recent Outpatient Visits           3 days ago Primary hypertension   [place] [facility] [place], [place] W, NP   2 months ago Type 2 diabetes mellitus with hyperglycemia, without long-term current use of insulin [facility])   [place] [facility] [place], Angeline ORN, NP   4 months ago Primary hypertension   [place] [facility] [place], Angeline ORN, NP   5 months ago Encounter for general adult medical examination with abnormal findings   [place] [facility] [place], Angeline ORN, NP

## 2024-03-31 ENCOUNTER — Other Ambulatory Visit: Payer: Self-pay

## 2024-03-31 ENCOUNTER — Other Ambulatory Visit: Payer: Self-pay | Admitting: Internal Medicine

## 2024-03-31 NOTE — Telephone Encounter (Signed)
 Patient came into the office this morning to request reschedule of his colonoscopy due to planning retirement and insurance changes.  Colonoscopy has been rescheduled from 04/03/24 to 07/20/24 still with Dr. Jinny.  Instructions updated.  Referral updated.  Trish notified.  Thanks,  [place], CMA

## 2024-04-03 ENCOUNTER — Ambulatory Visit: Admission: RE | Admit: 2024-04-03 | Source: Home / Self Care | Admitting: Gastroenterology

## 2024-07-20 ENCOUNTER — Encounter: Admission: RE | Payer: Self-pay | Source: Home / Self Care

## 2024-09-25 ENCOUNTER — Encounter: Admitting: Internal Medicine
